# Patient Record
Sex: Male | Born: 1965 | Race: White | Hispanic: No | Marital: Married | Smoking: Never smoker
Health system: Southern US, Community
[De-identification: ages and names within clinical notes are randomized; demographics above are authoritative.]

## PROBLEM LIST (undated history)

## (undated) DIAGNOSIS — K922 Gastrointestinal hemorrhage, unspecified: Secondary | ICD-10-CM

## (undated) DIAGNOSIS — T7840XA Allergy, unspecified, initial encounter: Secondary | ICD-10-CM

## (undated) DIAGNOSIS — E785 Hyperlipidemia, unspecified: Secondary | ICD-10-CM

## (undated) DIAGNOSIS — I2510 Atherosclerotic heart disease of native coronary artery without angina pectoris: Secondary | ICD-10-CM

## (undated) HISTORY — DX: Atherosclerotic heart disease of native coronary artery without angina pectoris: I25.10

## (undated) HISTORY — DX: Allergy, unspecified, initial encounter: T78.40XA

## (undated) HISTORY — DX: Gastrointestinal hemorrhage, unspecified: K92.2

## (undated) HISTORY — DX: Hyperlipidemia, unspecified: E78.5

---

## 2007-02-25 ENCOUNTER — Ambulatory Visit: Payer: Self-pay | Admitting: Family Medicine

## 2008-01-26 ENCOUNTER — Ambulatory Visit: Payer: Self-pay | Admitting: Family Medicine

## 2009-01-11 ENCOUNTER — Ambulatory Visit: Payer: Self-pay | Admitting: Family Medicine

## 2009-05-02 ENCOUNTER — Ambulatory Visit: Payer: Self-pay | Admitting: Family Medicine

## 2009-05-10 ENCOUNTER — Ambulatory Visit: Payer: Self-pay | Admitting: Sports Medicine

## 2009-05-10 DIAGNOSIS — M722 Plantar fascial fibromatosis: Secondary | ICD-10-CM | POA: Insufficient documentation

## 2009-05-10 DIAGNOSIS — M7660 Achilles tendinitis, unspecified leg: Secondary | ICD-10-CM | POA: Insufficient documentation

## 2009-05-10 DIAGNOSIS — M7750 Other enthesopathy of unspecified foot: Secondary | ICD-10-CM | POA: Insufficient documentation

## 2009-06-14 ENCOUNTER — Ambulatory Visit: Payer: Self-pay | Admitting: Sports Medicine

## 2010-03-01 ENCOUNTER — Ambulatory Visit: Payer: Self-pay | Admitting: Sports Medicine

## 2010-03-01 DIAGNOSIS — M25519 Pain in unspecified shoulder: Secondary | ICD-10-CM | POA: Insufficient documentation

## 2010-03-27 ENCOUNTER — Ambulatory Visit: Payer: Self-pay | Admitting: Family Medicine

## 2010-11-15 NOTE — Assessment & Plan Note (Signed)
Summary: F/U,MC   Vital Signs:  Patient profile:   45 year old male BP sitting:   125 / 84  Vitals Entered By: Lillia Pauls CMA (Mar 01, 2010 10:06 AM)  History of Present Illness: Bikes, running and tennis still had problems w left achilles did exercises went 8 wks after stopping biking  also stopped some tennis  now feels some throbbing p bike ride  runs -20 to 24 mpw  some tightness in left AT again  has had a recent cleat fitting  Physical Exam  General:  Well-developed,well-nourished,in no acute distress; alert,appropriate and cooperative throughout examination Msk:  RT has mod mid foot pronation left slt pronation short 1 MT segments bilat mortons type foot bilat  NO AT nodules today Additional Exam:  MSK [place]  LT AT is now 0.45 thickness and was 0.61 RT AT is now 0.44 tendon looks good  resolution of fluid there does remain some increase in bursal fluid on left only  images saved   Impression & Recommendations:  Problem # 1:  ACHILLES TENDINITIS (ICD-726.71)  Orders: Sports Insoles (F6213)  this is much improved still gets some pain in bike shoes change to soft heel pad remove the OTC orthotic  for running cont in sports insoles do the exerc at least 3x wk  Problem # 2:  SHOULDER PAIN, RIGHT (ICD-719.41) with neg exam I think this is tennis form issue  suggested working w pro on serving form  reck if pain resumes

## 2011-03-12 ENCOUNTER — Ambulatory Visit: Payer: Self-pay | Admitting: Family Medicine

## 2011-03-12 ENCOUNTER — Ambulatory Visit (INDEPENDENT_AMBULATORY_CARE_PROVIDER_SITE_OTHER): Payer: BC Managed Care – PPO | Admitting: Family Medicine

## 2011-03-12 ENCOUNTER — Encounter: Payer: Self-pay | Admitting: Family Medicine

## 2011-03-12 VITALS — BP 110/70 | HR 66 | Wt 177.0 lb

## 2011-03-12 DIAGNOSIS — M79671 Pain in right foot: Secondary | ICD-10-CM

## 2011-03-12 DIAGNOSIS — B3789 Other sites of candidiasis: Secondary | ICD-10-CM

## 2011-03-12 DIAGNOSIS — M79609 Pain in unspecified limb: Secondary | ICD-10-CM

## 2011-03-12 DIAGNOSIS — J029 Acute pharyngitis, unspecified: Secondary | ICD-10-CM

## 2011-03-12 LAB — POCT RAPID STREP A (OFFICE): Rapid Strep A Screen: NEGATIVE

## 2011-03-12 MED ORDER — FLUCONAZOLE 150 MG PO TABS: 150.0000 mg | ORAL_TABLET | Freq: Once | ORAL | Status: AC

## 2011-03-12 NOTE — Progress Notes (Signed)
  Subjective:    Patient ID: Travis Hoffman, male    DOB: 1966/06/17, 45 y.o.   MRN: [id number]  HPI he has a 3 day history of slight sore throat but today got worse and is associated with white spots on his tonsils; he also has a  headache. No cough, congestion, earache, rhinorrhea. He does not smoke. He also has a two-week history of right medial ankle pain. He runs and cycles regularly. No change and is running or cycling pattern, shoes. He did state that he use to spare pair socks. There is no discomfort with physical activity. Has hurt more after he is done    Review of Systems     Objective:   Physical Exam  alert and in no distress. Tympanic membranes and canals are normal. Throat shows whitish exudates that were KOH positive. Tonsils are normal. Neck is supple without adenopathy or thyromegaly. Cardiac exam shows a regular sinus rhythm without murmurs or gallops. Lungs are clear to auscultation. Strep test negative   right ankle exam shows slight tenderness to palpation to the posterior aspect of the medial malleolus. Full motion of the ankle. No laxity noted. Good strength.    Assessment & Plan:  Yeast pharyngitis Right ankle pain  I will give Diflucan 150 mg. Also recommend anti-inflammatory and wearing thicker sock to see if this will help.

## 2011-03-12 NOTE — Patient Instructions (Signed)
Use heat for 20 minutes 3 times a day, Advil for the pain and wear thicker socks. Call in a pill for the thrush

## 2011-04-02 ENCOUNTER — Encounter: Payer: Self-pay | Admitting: Family Medicine

## 2011-04-22 ENCOUNTER — Encounter: Payer: Self-pay | Admitting: Family Medicine

## 2011-04-24 ENCOUNTER — Ambulatory Visit (INDEPENDENT_AMBULATORY_CARE_PROVIDER_SITE_OTHER): Payer: BC Managed Care – PPO | Admitting: Family Medicine

## 2011-04-24 ENCOUNTER — Encounter: Payer: Self-pay | Admitting: Family Medicine

## 2011-04-24 VITALS — BP 110/70 | HR 60 | Ht 72.0 in | Wt 180.0 lb

## 2011-04-24 DIAGNOSIS — G4484 Primary exertional headache: Secondary | ICD-10-CM

## 2011-04-24 DIAGNOSIS — Z0000 Encounter for general adult medical examination without abnormal findings: Secondary | ICD-10-CM

## 2011-04-24 DIAGNOSIS — Z8249 Family history of ischemic heart disease and other diseases of the circulatory system: Secondary | ICD-10-CM

## 2011-04-24 DIAGNOSIS — J45990 Exercise induced bronchospasm: Secondary | ICD-10-CM

## 2011-04-24 DIAGNOSIS — R51 Headache: Secondary | ICD-10-CM

## 2011-04-24 LAB — POCT URINALYSIS DIPSTICK
Glucose, UA: NEGATIVE
Ketones, UA: NEGATIVE
Protein, UA: NEGATIVE
Spec Grav, UA: 1.015

## 2011-04-24 MED ORDER — ROSUVASTATIN CALCIUM 20 MG PO TABS: 20.0000 mg | ORAL_TABLET | Freq: Every day | ORAL | Status: DC

## 2011-04-24 NOTE — Progress Notes (Signed)
  Subjective:    Patient ID: Travis Hoffman, male    DOB: [birth year], 45 y.o.   MRN: [id number]  HPI He is here for a complete examination. He maintains a very physically active lifestyle. He has had some very his orthopedic problems and now is having some foot discomfort and is putting heat on this. 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 He does complain of difficulty with a diffuse headache especially after a good workout. He's had no numbness, tingling, blurred or double vision. There is a family history of heart disease with his father and mother having stents. His father had it in his early 61s. His social and family history were reviewed and are in the record.   Review of Systems  Constitutional: Negative.   HENT: Negative.   Eyes: Negative.   Respiratory: Negative.   Cardiovascular: Negative.   Gastrointestinal: Negative.   Genitourinary: Negative.   Neurological: Negative.        Objective:   Physical Exam BP 110/70  Pulse 60  Ht 6' (1.829 m)  Wt 180 lb (81.647 kg)  BMI 24.41 kg/m2  General Appearance:    Alert, cooperative, no distress, appears stated age  Head:    Normocephalic, without obvious abnormality, atraumatic  Eyes:    PERRL, conjunctiva/corneas clear, EOM's intact, fundi    benign  Ears:    Normal TM's and external ear canals  Nose:   Nares normal, mucosa normal, no drainage or sinus   tenderness  Throat:   Lips, mucosa, and tongue normal; teeth and gums normal  Neck:   Supple, no lymphadenopathy;  thyroid:  no   enlargement/tenderness/nodules; no carotid   bruit or JVD  Back:    Spine nontender, no curvature, ROM normal, no CVA     tenderness  Lungs:     Clear to auscultation bilaterally without wheezes, rales or     ronchi; respirations unlabored  Chest Wall:    No tenderness or deformity   Heart:    Regular rate and rhythm, S1 and S2 normal, no murmur, rub   or gallop  Breast Exam:     No chest wall tenderness, masses or gynecomastia  Abdomen:     Soft, non-tender, nondistended, normoactive bowel sounds,    no masses, no hepatosplenomegaly  Genitalia:    Normal male external genitalia without lesions.  Testicles without masses.  No inguinal hernias.  Rectal:    Normal sphincter tone, no masses or tenderness; guaiac negative stool.  Prostate smooth, no nodules, not enlarged.  Extremities:   No clubbing, cyanosis or edema  Pulses:   2+ and symmetric all extremities  Skin:   Skin color, texture, turgor normal, no rashes or lesions  Lymph nodes:   Cervical, supraclavicular, and axillary nodes normal  Neurologic:   CNII-XII intact, normal strength, sensation and gait; reflexes 2+ and symmetric throughout          Psych:   Normal mood, affect, hygiene and grooming.           Assessment & Plan:   Family history of heart disease. EIB exertional headache Recommend Tylenol or Advil for exertional headache. Continue on Crestor. Continue with his physical activity level. We discussed followup on his heart disease. Explained that there is no protocol concerning when to send him for evaluation.

## 2011-04-24 NOTE — Patient Instructions (Signed)
Premedicate yourself prior to exercise with Tylenol or Advil to help with her headaches. Continue on your present medications.

## 2011-04-25 ENCOUNTER — Telehealth: Payer: Self-pay

## 2011-04-25 LAB — COMPREHENSIVE METABOLIC PANEL
BUN: 17 mg/dL (ref 6–23)
CO2: 25 mEq/L (ref 19–32)
Calcium: 9.5 mg/dL (ref 8.4–10.5)
Chloride: 102 mEq/L (ref 96–112)
Creat: 1.03 mg/dL (ref 0.50–1.35)
Glucose, Bld: 87 mg/dL (ref 70–99)

## 2011-04-25 LAB — CBC WITH DIFFERENTIAL/PLATELET
Basophils Absolute: 0 10*3/uL (ref 0.0–0.1)
Eosinophils Relative: 5 % (ref 0–5)
HCT: 41.4 % (ref 39.0–52.0)
Lymphocytes Relative: 37 % (ref 12–46)
Lymphs Abs: 2.3 10*3/uL (ref 0.7–4.0)
MCV: 81.5 fL (ref 78.0–100.0)
Monocytes Absolute: 0.6 10*3/uL (ref 0.1–1.0)
Monocytes Relative: 9 % (ref 3–12)
RDW: 14.3 % (ref 11.5–15.5)
WBC: 6.2 10*3/uL (ref 4.0–10.5)

## 2011-04-25 LAB — LIPID PANEL
Cholesterol: 187 mg/dL (ref 0–200)
HDL: 62 mg/dL (ref >39)
Total CHOL/HDL Ratio: 3 Ratio

## 2011-04-25 NOTE — Telephone Encounter (Signed)
Left message for pt of labs and mailed diet info

## 2011-06-07 ENCOUNTER — Other Ambulatory Visit: Payer: Self-pay | Admitting: Family Medicine

## 2011-12-05 ENCOUNTER — Telehealth: Payer: Self-pay | Admitting: Internal Medicine

## 2011-12-05 NOTE — Telephone Encounter (Signed)
Called pt left message for him he needed an appt to come in and discuss change of med due to ins no longer paying for crestor

## 2011-12-05 NOTE — Telephone Encounter (Signed)
Patient needs an appointment to discuss switching meds.

## 2011-12-12 ENCOUNTER — Ambulatory Visit (INDEPENDENT_AMBULATORY_CARE_PROVIDER_SITE_OTHER): Payer: BC Managed Care – PPO | Admitting: Family Medicine

## 2011-12-12 VITALS — Wt 177.0 lb

## 2011-12-12 DIAGNOSIS — E785 Hyperlipidemia, unspecified: Secondary | ICD-10-CM

## 2011-12-12 MED ORDER — ATORVASTATIN CALCIUM 40 MG PO TABS: 40.0000 mg | ORAL_TABLET | Freq: Every day | ORAL | Status: DC

## 2011-12-12 NOTE — Patient Instructions (Signed)
If you have any trouble with aches and pains let me know

## 2011-12-12 NOTE — Progress Notes (Signed)
  Subjective:    Patient ID: Travis Hoffman, male    DOB: 10-16-1965, 46 y.o.   MRN: [id number]  HPI He is here for consultation. His insurance will not cover Crestor. He has not been on any other medications. Review of his record indicates he does have a positive family history for heart disease. He has had no difficulty with Crestor.  Review of Systems     Objective:   Physical Exam Alert and in no distress otherwise not examined       Assessment & Plan:  Hyperlipidemia. I will place him on Lipitor. Discussed possible side effects of the medication with him. He will return here in 2 months for recheck or call me if he has any side effects.

## 2012-02-11 ENCOUNTER — Ambulatory Visit: Payer: BC Managed Care – PPO | Admitting: Family Medicine

## 2012-03-11 ENCOUNTER — Encounter: Payer: Self-pay | Admitting: Family Medicine

## 2012-03-11 ENCOUNTER — Ambulatory Visit (INDEPENDENT_AMBULATORY_CARE_PROVIDER_SITE_OTHER): Payer: BC Managed Care – PPO | Admitting: Family Medicine

## 2012-03-11 VITALS — BP 120/70 | HR 59 | Wt 176.0 lb

## 2012-03-11 DIAGNOSIS — M7750 Other enthesopathy of unspecified foot: Secondary | ICD-10-CM

## 2012-03-11 DIAGNOSIS — Z8249 Family history of ischemic heart disease and other diseases of the circulatory system: Secondary | ICD-10-CM | POA: Insufficient documentation

## 2012-03-11 DIAGNOSIS — Z23 Encounter for immunization: Secondary | ICD-10-CM

## 2012-03-11 DIAGNOSIS — E785 Hyperlipidemia, unspecified: Secondary | ICD-10-CM

## 2012-03-11 DIAGNOSIS — Z79899 Other long term (current) drug therapy: Secondary | ICD-10-CM

## 2012-03-11 LAB — LIPID PANEL
Cholesterol: 142 mg/dL (ref 0–200)
LDL Cholesterol: 82 mg/dL (ref 0–99)
Total CHOL/HDL Ratio: 2.9 Ratio
VLDL: 11 mg/dL (ref 0–40)

## 2012-03-11 NOTE — Progress Notes (Signed)
  Subjective:    Patient ID: Travis Hoffman, male    DOB: 1965-12-24, 46 y.o.   MRN: [id number]  HPI He is here originally for recheck on his lipids. He does have a positive family history for heart disease. Review his record also indicates need for immunization update. He also complains of difficulty with his left foot. He does have a previous history of difficulty with this. This causes him mainly over the metatarsal area when he rides his bicycle. He does use of foot clip. His pain is mainly in the second and third metatarsal area.   Review of Systems     Objective:   Physical Exam Slight callus formation noted over the head of the second metatarsal. No tenderness to palpation. Compression testing negative. Good motion of his ankle.       Assessment & Plan:   1. METATARSALGIA    2. Hyperlipidemia LDL goal <70  Lipid panel  3. Encounter for long-term (current) use of other medications  Lipid panel  4. Family history of heart disease in male family member before age 78    5. Need for immunization against pertussis  Tdap vaccine greater than or equal to 7yo IM   discussed various options concerning the metatarsal pain. Recommended to possibly try changing the valgus versus varus stress and possibly using an arch support to take some pressure off the metatarsal heads.

## 2012-03-11 NOTE — Patient Instructions (Signed)
Try building up in your arch area to take some pressure off the metatarsals

## 2012-11-04 ENCOUNTER — Other Ambulatory Visit: Payer: Self-pay | Admitting: Family Medicine

## 2013-02-01 ENCOUNTER — Encounter: Payer: Self-pay | Admitting: Family Medicine

## 2013-02-01 ENCOUNTER — Ambulatory Visit (INDEPENDENT_AMBULATORY_CARE_PROVIDER_SITE_OTHER): Payer: Managed Care, Other (non HMO) | Admitting: Family Medicine

## 2013-02-01 VITALS — BP 114/70 | HR 72 | Ht 72.5 in | Wt 178.0 lb

## 2013-02-01 DIAGNOSIS — G4484 Primary exertional headache: Secondary | ICD-10-CM | POA: Insufficient documentation

## 2013-02-01 DIAGNOSIS — G8929 Other chronic pain: Secondary | ICD-10-CM | POA: Insufficient documentation

## 2013-02-01 DIAGNOSIS — J45990 Exercise induced bronchospasm: Secondary | ICD-10-CM

## 2013-02-01 DIAGNOSIS — Z0000 Encounter for general adult medical examination without abnormal findings: Secondary | ICD-10-CM

## 2013-02-01 DIAGNOSIS — M545 Low back pain, unspecified: Secondary | ICD-10-CM

## 2013-02-01 DIAGNOSIS — Z8249 Family history of ischemic heart disease and other diseases of the circulatory system: Secondary | ICD-10-CM

## 2013-02-01 DIAGNOSIS — R51 Headache: Secondary | ICD-10-CM

## 2013-02-01 LAB — LIPID PANEL
Cholesterol: 175 mg/dL (ref 0–200)
HDL: 58 mg/dL (ref >39)
LDL Cholesterol: 107 mg/dL — ABNORMAL HIGH (ref 0–99)
Triglycerides: 50 mg/dL (ref <150)

## 2013-02-01 LAB — COMPREHENSIVE METABOLIC PANEL
Alkaline Phosphatase: 48 U/L (ref 39–117)
BUN: 20 mg/dL (ref 6–23)
Chloride: 102 mEq/L (ref 96–112)
Creat: 0.95 mg/dL (ref 0.50–1.35)
Glucose, Bld: 94 mg/dL (ref 70–99)
Potassium: 4.5 mEq/L (ref 3.5–5.3)
Total Protein: 7 g/dL (ref 6.0–8.3)

## 2013-02-01 LAB — CBC WITH DIFFERENTIAL/PLATELET
Basophils Absolute: 0 10*3/uL (ref 0.0–0.1)
Basophils Relative: 1 % (ref 0–1)
Eosinophils Relative: 5 % (ref 0–5)
HCT: 40.2 % (ref 39.0–52.0)
Hemoglobin: 13.3 g/dL (ref 13.0–17.0)
MCHC: 33.1 g/dL (ref 30.0–36.0)
MCV: 79 fL (ref 78.0–100.0)
Monocytes Absolute: 0.4 10*3/uL (ref 0.1–1.0)
Monocytes Relative: 7 % (ref 3–12)
Neutro Abs: 2.5 10*3/uL (ref 1.7–7.7)
RDW: 14.5 % (ref 11.5–15.5)

## 2013-02-01 LAB — POCT URINALYSIS DIPSTICK
Blood, UA: NEGATIVE
Ketones, UA: NEGATIVE
Leukocytes, UA: NEGATIVE
Protein, UA: NEGATIVE
Spec Grav, UA: 1.015
pH, UA: 6

## 2013-02-01 LAB — HEMOCCULT GUIAC POC 1CARD (OFFICE)

## 2013-02-01 MED ORDER — ALBUTEROL SULFATE HFA 108 (90 BASE) MCG/ACT IN AERS: 2.0000 | INHALATION_SPRAY | Freq: Four times a day (QID) | RESPIRATORY_TRACT | Status: DC | PRN

## 2013-02-01 NOTE — Progress Notes (Signed)
Subjective:    Patient ID: Travis Hoffman, male    DOB: 1966-07-29, 47 y.o.   MRN: [id number]  HPI He is here for complete examination. He continues to have difficulty with left-sided back discomfort. He is not interested in taking any medications but just wanted to inform me of this. He also has EIB and continues on Xopenex. He was originally given this due to sympathetic effect of the albuterol however he notes the same symptoms with the Xopenex and would like to be switched to the lesser cost version. He also has a history of exertional headaches especially after long physical activity. Usually this goes away within a short period time at this time he is not interested in pursuing this.He is noting some difficulty with reading but has yet not gotten reading glasses. He also has concerns over his physical activity and risk of cardiac disease. He also has concerns over his toenails.   Review of Systems Negative except as above    Objective:   Physical Exam BP 114/70  Pulse 72  Ht 6' 0.5" (1.842 m)  Wt 178 lb (80.74 kg)  BMI 23.8 kg/m2  General Appearance:    Alert, cooperative, no distress, appears stated age  Head:    Normocephalic, without obvious abnormality, atraumatic  Eyes:    PERRL, conjunctiva/corneas clear, EOM's intact, fundi    benign  Ears:    Normal TM's and external ear canals  Nose:   Nares normal, mucosa normal, no drainage or sinus   tenderness  Throat:   Lips, mucosa, and tongue normal; teeth and gums normal  Neck:   Supple, no lymphadenopathy;  thyroid:  no   enlargement/tenderness/nodules; no carotid   bruit or JVD  Back:    Spine nontender, no curvature, ROM normal, no CVA     tenderness  Lungs:     Clear to auscultation bilaterally without wheezes, rales or     ronchi; respirations unlabored  Chest Wall:    No tenderness or deformity   Heart:    Regular rate and rhythm, S1 and S2 normal, no murmur, rub   or gallop  Breast Exam:    No chest wall tenderness,  masses or gynecomastia  Abdomen:     Soft, non-tender, nondistended, normoactive bowel sounds,    no masses, no hepatosplenomegaly  Genitalia:  deferred  Rectal:    Normal sphincter tone, no masses or tenderness; guaiac negative stool.  Prostate smooth, no nodules, not enlarged.  Extremities:   No clubbing, cyanosis or edema  Pulses:   2+ and symmetric all extremities  Skin:   Skin color, texture, turgor normal, no rashes or lesions  Lymph nodes:   Cervical, supraclavicular, and axillary nodes normal  Neurologic:   CNII-XII intact, normal strength, sensation and gait; reflexes 2+ and symmetric throughout          Psych:   Normal mood, affect, hygiene and grooming.        Assessment & Plan:  Routine general medical examination at a health care facility - Plan: Lipid panel, CBC with Differential, Comprehensive metabolic panel, Urinalysis Dipstick, POCT occult blood stool  Family history of heart disease in male family member before age 35  Exercise-induced asthma - Plan: albuterol (PROVENTIL HFA;VENTOLIN HFA) 108 (90 BASE) MCG/ACT inhaler  Exertional headache  Chronic low back pain I discussed his risk for heart disease in the fact that he is taking good care of himself. I do the present circumstances no further intervention is really needed. He  was comfortable with this. I will switch him to Proventil and give him refills as needed. He is comfortable with handling his exertional headaches. We also discussed proper care for his back with stretching and range of motion. He has no evidence of onychomycosis therefore no intervention is needed.

## 2013-02-02 NOTE — Addendum Note (Signed)
Addended by: Ronnald Nian on: 02/02/2013 05:22 PM   Modules accepted: Level of Service

## 2013-02-02 NOTE — Progress Notes (Signed)
Quick Note:  CALLED PT TO INFORM HIM OF LABS LEFT MESSAGE WORD FOR WORD Labs look good. Continue present medications ______

## 2013-02-05 ENCOUNTER — Other Ambulatory Visit: Payer: Self-pay | Admitting: Family Medicine

## 2013-02-05 MED ORDER — ATORVASTATIN CALCIUM 40 MG PO TABS: 40.0000 mg | ORAL_TABLET | Freq: Every day | ORAL | Status: DC

## 2013-02-05 NOTE — Telephone Encounter (Signed)
Needs lipitor refill sent to [facility] mail order pharmacy

## 2013-07-22 ENCOUNTER — Other Ambulatory Visit: Payer: Self-pay | Admitting: Family Medicine

## 2013-08-19 ENCOUNTER — Other Ambulatory Visit: Payer: Self-pay

## 2014-02-07 ENCOUNTER — Encounter: Payer: Self-pay | Admitting: Family Medicine

## 2014-02-07 ENCOUNTER — Ambulatory Visit (INDEPENDENT_AMBULATORY_CARE_PROVIDER_SITE_OTHER): Payer: BC Managed Care – PPO | Admitting: Family Medicine

## 2014-02-07 VITALS — BP 116/78 | HR 64 | Ht 73.0 in | Wt 177.0 lb

## 2014-02-07 DIAGNOSIS — M545 Low back pain, unspecified: Secondary | ICD-10-CM

## 2014-02-07 DIAGNOSIS — E785 Hyperlipidemia, unspecified: Secondary | ICD-10-CM

## 2014-02-07 DIAGNOSIS — G8929 Other chronic pain: Secondary | ICD-10-CM

## 2014-02-07 DIAGNOSIS — J3489 Other specified disorders of nose and nasal sinuses: Secondary | ICD-10-CM

## 2014-02-07 DIAGNOSIS — Z0000 Encounter for general adult medical examination without abnormal findings: Secondary | ICD-10-CM

## 2014-02-07 DIAGNOSIS — J45990 Exercise induced bronchospasm: Secondary | ICD-10-CM

## 2014-02-07 DIAGNOSIS — Z8249 Family history of ischemic heart disease and other diseases of the circulatory system: Secondary | ICD-10-CM

## 2014-02-07 LAB — CBC WITH DIFFERENTIAL/PLATELET
BASOS ABS: 0 10*3/uL (ref 0.0–0.1)
BASOS PCT: 0 % (ref 0–1)
Eosinophils Absolute: 0.2 10*3/uL (ref 0.0–0.7)
Eosinophils Relative: 4 % (ref 0–5)
HCT: 39.3 % (ref 39.0–52.0)
HEMOGLOBIN: 13.3 g/dL (ref 13.0–17.0)
Lymphocytes Relative: 39 % (ref 12–46)
Lymphs Abs: 2.2 10*3/uL (ref 0.7–4.0)
MCH: 27 pg (ref 26.0–34.0)
MCHC: 33.8 g/dL (ref 30.0–36.0)
MCV: 79.9 fL (ref 78.0–100.0)
MONOS PCT: 10 % (ref 3–12)
Monocytes Absolute: 0.6 10*3/uL (ref 0.1–1.0)
NEUTROS ABS: 2.7 10*3/uL (ref 1.7–7.7)
NEUTROS PCT: 47 % (ref 43–77)
Platelets: 229 10*3/uL (ref 150–400)
RBC: 4.92 MIL/uL (ref 4.22–5.81)
RDW: 14.6 % (ref 11.5–15.5)
WBC: 5.7 10*3/uL (ref 4.0–10.5)

## 2014-02-07 LAB — LIPID PANEL
Cholesterol: 162 mg/dL (ref 0–200)
HDL: 56 mg/dL (ref >39)
LDL CALC: 97 mg/dL (ref 0–99)
TRIGLYCERIDES: 44 mg/dL (ref <150)
Total CHOL/HDL Ratio: 2.9 Ratio
VLDL: 9 mg/dL (ref 0–40)

## 2014-02-07 LAB — POCT URINALYSIS DIPSTICK
Bilirubin, UA: NEGATIVE
Blood, UA: NEGATIVE
Glucose, UA: NEGATIVE
KETONES UA: NEGATIVE
LEUKOCYTES UA: NEGATIVE
Nitrite, UA: NEGATIVE
PROTEIN UA: NEGATIVE
Spec Grav, UA: 1.015
UROBILINOGEN UA: NEGATIVE
pH, UA: 6

## 2014-02-07 LAB — HEMOCCULT GUIAC POC 1CARD (OFFICE)

## 2014-02-07 LAB — COMPREHENSIVE METABOLIC PANEL
ALK PHOS: 47 U/L (ref 39–117)
ALT: 23 U/L (ref 0–53)
AST: 29 U/L (ref 0–37)
Albumin: 4.4 g/dL (ref 3.5–5.2)
BILIRUBIN TOTAL: 0.9 mg/dL (ref 0.2–1.2)
BUN: 19 mg/dL (ref 6–23)
CO2: 28 mEq/L (ref 19–32)
Calcium: 9.4 mg/dL (ref 8.4–10.5)
Chloride: 104 mEq/L (ref 96–112)
Creat: 0.97 mg/dL (ref 0.50–1.35)
Glucose, Bld: 88 mg/dL (ref 70–99)
Potassium: 4.5 mEq/L (ref 3.5–5.3)
SODIUM: 138 meq/L (ref 135–145)
TOTAL PROTEIN: 6.5 g/dL (ref 6.0–8.3)

## 2014-02-07 MED ORDER — ATORVASTATIN CALCIUM 40 MG PO TABS: ORAL_TABLET | ORAL | Status: DC

## 2014-02-07 NOTE — Progress Notes (Signed)
Subjective:    Patient ID: Travis Hoffman, male    DOB: 10/06/1966, 48 y.o.   MRN: [id number]  HPI  Travis Hoffman is a very pleasant 48 y.o. yo male who  has a past medical history of Allergy; Dyslipidemia; GIB (gastrointestinal bleeding); and Cardiovascular disease. He presents today for his annual physical.   The patient is doing well but has a few matters he would like to discuss today. The patient reports some pain in the distal joints of the hands that has been present for many years. The pain has been getting slightly worse over the course of the last few months. The patient also describes the worst pain being on his index finger where he has also noted a "bump." He isn't sure when the bump appeared but notes that it is not red, swollen or particularly painful. The patient reports that he has recently added tumeric and ginger to his diet, and believes this may be helping.  The patient also reports residual soreness in his left hand from a fall in December. The patient fell on his outstretched hand and at the time experienced some sharp pains with tight gripping actions. Now those pains have resolved and now has occasional soreness deep near the distal palm and pain with deep palpation of that same area. The patient denies any sharp shooting pain, pain with movement, numbness, tingling or lack of strength in that hand.     The patient recently also began experience pain with lateral movement of his wrist. The pain began three weeks ago when the patient changed the handlebar adjustments on his bike. Since readjusting the handlebars the patient feels his pain is improving but does think it may be taking too long to resolve. The patient denies any sharp shooting pain, pain with movement, numbness, tingling or lack of strength in his wrists.   The patient has also had new occasional sharp pains in his stomach. The pain is located bilaterally in his lower abdomen and has been occuring off and  on for the last month or so. The patient believes the pain is worse in the evenings and worse when he hasn't eaten in 3-4 hours. The patient believes these pains are related to some anxiety concerning his daughter at home and that his anxiety causes these pains to be worse in the evening when he is dealing with those stressors. The patient denies any diarrhea, constipation, changes in stool appearance, nausea, vomiting, unintentional weight loss or early satiety.   The patient would also like a bump on his noes looked at. The spot is located on the left side and has been present the patient's whole life. The patient reports no new changes, itching, bleeding or growth of the area.   The patient's family life is stable other than the previously mentioned anxiety concerning his daughter. His daughter has a history of mental health issues and the patient is worried about a "slippry slope" effect. Despite this the patient notes that he thinks his anxiety in the given situation is "excessive" and he feels like the stress is affecting him more than it should. He has never experienced excessive anxiety in the past and is not sure why this particular situation has triggered this. He has occasionally experienced chest tightness (not related to exertion) associated with this anxiety. The patient exercises everyday and feels that this may help him cope with some anxiety. He does not wish to pursue medical therapy for his anxiety at this time.  The patient feels that his exercise induced asthma is well controlled with his inhaler, though he does note incomplete relief with very strenuous interval training on cold dry days.   The patient reports taking his medications as prescribed and needs refills at this time. The patient was concerned about his own cholesterol given his fathers history of early CV disease. He was counciled extensively on the biochemistry of lipids, the role of the liver, the impact of diet and  exercise, importance of medications and his personal overall risk factors.   The patient has between 0-4 alcoholic drinks a week and does not smoke tobacco. The patient has an excellent diet with lots of vegetables and proteins and minimal carbohydrates and sugar. The patient exercises 7 times a week with both interval, cardio and strength training.   Health Maintenance: patient is UTD on Tdap and we will do a DRE today for prostate and colon CA screening.   Review of Systems is negative except per HPI.    Objective:   Physical Exam  Constitutional: Patient is well-developed, well-nourished, and in no distress. HENT: Head is normocephalic and atraumatic.  Mouth/Throat: Oropharynx is clear and moist without erythema or exudates Nose: 1 mm umbilicate papule located on the left nasal sidewall. Central umbilication is black in color. No erythema or swelling at the site.   Eyes: Conjunctivae and EOM are normal. Pupils are equal, round, and reactive to light.  Cardiovascular: Normal rate, regular rhythm. Exam reveals no murmurs, gallops and no friction rub.  Pulmonary/Chest: Effort normal and CTAB. No respiratory distress. No wheezes or ronchi.   Abdominal: Soft, non-tender and non-distended. There is no rebound or guarding. No HSM.  Neurological: Patient is alert and oriented to person, place, and time.  Reflex Scores: Biceps and Patellar reflexes were 2+ and equal bilaterally.  Skin: Skin is warm and dry. 1 cm lesion with a central area of pigmentation that is slightly indented is noted on the right nasal labial area. DRE: smooth prostate without obvious irregularities or nodularities Psychiatric: Affect normal.   FOBT: negative    Assessment & Plan:  Routine general medical examination at a health care facility - Plan: Lipid Panel, CBC with Differential, POCT urinalysis dipstick, Comprehensive metabolic panel, POCT occult blood stool  Family history of heart disease in male family member  before age 47  Hyperlipidemia LDL goal <70 - Plan: atorvastatin (LIPITOR) 40 MG tablet  Exercise-induced asthma  Chronic low back pain  Nasal lesion - Plan: Ambulatory referral to Dermatology  Patient was counciled extensively on lipids, personal cardivascular risk profile and the impact of diet and exercise. We also discussed the stress he is under dealing with his daughter. Encouraged him to get involved in counseling to learn how to better handle it from his perspective and secondarily his daughter. Apparently his wife has a better handle on this. Discussed the aches and pains that he is having in general and explained a lot of arthritic in nature. The abdominal pain I think will eventually go way and does seem to be psychological in nature.

## 2014-02-07 NOTE — Progress Notes (Deleted)
 Subjective:    Patient ID: Travis Hoffman, male    DOB: 07/06/1966, 48 y.o.   MRN: [id number]  HPI  [name] is a very pleasant 48 y.o. yo male who  has a past medical history of Allergy; Dyslipidemia; GIB (gastrointestinal bleeding); and Cardiovascular disease. He presents today for his annual physical.   The patient is doing well but has a few matters he would like to discuss today. The patient reports some pain in the distal joints of the hands that has been present for many years. The pain has been getting slightly worse over the course of the last few months. The patient also describes the worst pain being on his index finger where he has also noted a "bump." He isn't sure when the bump appeared but notes that it is not red, swollen or particularly painful. The patient reports that he has recently added tumeric and ginger to his diet, and believes this may be helping.  The patient also reports residual soreness in his left hand from a fall in December. The patient fell on his outstretched hand and at the time experienced some sharp pains with tight gripping actions. Now those pains have resolved and now has occasional soreness deep near the distal palm and pain with deep palpation of that same area. The patient denies any sharp shooting pain, pain with movement, numbness, tingling or lack of strength in that hand.     The patient recently also began experience pain with lateral movement of his wrist. The pain began three weeks ago when the patient changed the handlebar adjustments on his bike. Since readjusting the handlebars the patient feels his pain is improving but does think it may be taking too long to resolve. The patient denies any sharp shooting pain, pain with movement, numbness, tingling or lack of strength in his wrists.   The patient has also had new occasional sharp pains in his stomach. The pain is located bilaterally in his lower abdomen and has been occuring off and  on for the last month or so. The patient believes the pain is worse in the evenings and worse when he hasn't eaten in 3-4 hours. The patient believes these pains are related to some anxiety concerning his daughter at home and that his anxiety causes these pains to be worse in the evening when he is dealing with those stressors. The patient denies any diarrhea, constipation, changes in stool appearance, nausea, vomiting, unintentional weight loss or early satiety.   The patient would also like a bump on his noes looked at. The spot is located on the left side and has been present the patient's whole life. The patient reports no new changes, itching, bleeding or growth of the area.   The patient's family life is stable other than the previously mentioned anxiety concerning his daughter. His daughter has a history of mental health issues and the patient is worried about a "slippry slope" effect. Despite this the patient notes that he thinks his anxiety in the given situation is "excessive" and he feels like the stress is affecting him more than it should. He has never experienced excessive anxiety in the past and is not sure why this particular situation has triggered this. He has occasionally experienced chest tightness (not related to exertion) associated with this anxiety. The patient exercises everyday and feels that this may help him cope with some anxiety. He does not wish to pursue medical therapy for his anxiety at this time.     The patient feels that his exercise induced asthma is well controlled with his inhaler, though he does note incomplete relief with very strenuous interval training on cold dry days.   The patient reports taking his medications as prescribed and needs refills at this time. The patient was concerned about his own cholesterol given his fathers history of early CV disease. He was counciled extensively on the biochemistry of lipids, the role of the liver, the impact of diet and  exercise, importance of medications and his personal overall risk factors.   The patient has between 0-4 alcoholic drinks a week and does not smoke tobacco. The patient has an excellent diet with lots of vegetables and proteins and minimal carbohydrates and sugar. The patient exercises 7 times a week with both interval, cardio and strength training.   Health Maintenance: patient is UTD on Tdap and we will do a DRE today for prostate and colon CA screening.   Review of Systems is negative except per HPI.    Objective:   Physical Exam  Constitutional: Patient is well-developed, well-nourished, and in no distress. HENT: Head is normocephalic and atraumatic.  Mouth/Throat: Oropharynx is clear and moist without erythema or exudates Nose: 1 mm umbilicate papule located on the left nasal sidewall. Central umbilication is black in color. No erythema or swelling at the site.   Eyes: Conjunctivae and EOM are normal. Pupils are equal, round, and reactive to light.  Cardiovascular: Normal rate, regular rhythm. Exam reveals no murmurs, gallops and no friction rub.  Pulmonary/Chest: Effort normal and CTAB. No respiratory distress. No wheezes or ronchi.   Abdominal: Soft, non-tender and non-distended. There is no rebound or guarding. No HSM.  Neurological: Patient is alert and oriented to person, place, and time.  Reflex Scores: Biceps and Patellar reflexes were 2+ and equal bilaterally.  Skin: Skin is warm and dry. 1 cm lesion with a central area of pigmentation that is slightly indented is noted on the right nasal labial area. DRE: smooth prostate without obvious irregularities or nodularities Psychiatric: Affect normal.   FOBT: negative    Assessment & Plan:  Routine general medical examination at a health care facility - Plan: Lipid Panel, CBC with Differential, POCT urinalysis dipstick, Comprehensive metabolic panel, POCT occult blood stool  Family history of heart disease in male family member  before age 47  Hyperlipidemia LDL goal <70 - Plan: atorvastatin (LIPITOR) 40 MG tablet  Exercise-induced asthma  Chronic low back pain  Nasal lesion - Plan: Ambulatory referral to Dermatology  Patient was counciled extensively on lipids, personal cardivascular risk profile and the impact of diet and exercise. We also discussed the stress he is under dealing with his daughter. Encouraged him to get involved in counseling to learn how to better handle it from his perspective and secondarily his daughter. Apparently his wife has a better handle on this. Discussed the aches and pains that he is having in general and explained a lot of arthritic in nature. The abdominal pain I think will eventually go way and does seem to be psychological in nature.

## 2014-02-07 NOTE — Progress Notes (Deleted)
   Subjective:    Patient ID: Travis Hoffman, male    DOB: 11/19/1965, 48 y.o.   MRN: [id number]  HPI    Review of Systems     Objective:   Physical Exam        Assessment & Plan:

## 2014-02-14 ENCOUNTER — Telehealth: Payer: Self-pay | Admitting: Family Medicine

## 2014-02-14 NOTE — Telephone Encounter (Signed)
Pt called for lab results.  Dr. Susann GivensLalonde put through his my chart all labs are normal.  Pt request copy emailed to [facility] [email or web address]

## 2014-03-01 ENCOUNTER — Ambulatory Visit (INDEPENDENT_AMBULATORY_CARE_PROVIDER_SITE_OTHER): Payer: BC Managed Care – PPO | Admitting: Medical

## 2014-03-01 ENCOUNTER — Telehealth: Payer: Self-pay | Admitting: Internal Medicine

## 2014-03-01 ENCOUNTER — Encounter: Payer: Self-pay | Admitting: Medical

## 2014-03-01 VITALS — BP 110/70 | HR 56 | Temp 98.0°F | Resp 14 | Wt 178.0 lb

## 2014-03-01 DIAGNOSIS — R319 Hematuria, unspecified: Secondary | ICD-10-CM

## 2014-03-01 LAB — POCT URINALYSIS DIPSTICK
BILIRUBIN UA: NEGATIVE
Blood, UA: NEGATIVE
Glucose, UA: NEGATIVE
KETONES UA: NEGATIVE
LEUKOCYTES UA: NEGATIVE
Nitrite, UA: NEGATIVE
PH UA: 6
Spec Grav, UA: 1.015
Urobilinogen, UA: NEGATIVE

## 2014-03-01 NOTE — Telephone Encounter (Signed)
Pt was notified about an appt with [facility] on Tuesday June 9th @ 2:20pm for nasal lesion

## 2014-03-01 NOTE — Progress Notes (Signed)
Subjective: Here for blood in urine . Had similar last year, lasted once urination.   He notes seeing blood/pink color in urine throughout the day yesterday.  Seems to be improving some.   Saw no obvious blood, just pink coloration in the urine.  No urgency, frequency, burning.    Did 1.5 hour mountain bike ride 2 days ago, and that night is when he saw the urine.    He also reports painful hunger pains, thinks its stress related.  Talked to Dr. Susann GivensLalonde about this recently as this physical.     Past Medical History  Diagnosis Date  . Allergy     RHINITIS  . Dyslipidemia   . GIB (gastrointestinal bleeding)   . Cardiovascular disease    ROS as in subjective  Objective: Gen: wd, wn, nad Back nontender Abdomen+bs, soft, nontender, no organomegaly, no mass GU norma male external genitalia, no mass, circumcised, no lymphadnopathy, no hernia Ext: no edema   Assessment: Encounter Diagnosis  Name Primary?  . Blood in urine Yes   Plan: We discussed his concerns.  Pink colored urine likely related to prostate inflammation from recent mountain bike riding.  Urinalysis normal today.  Advised good hydration, avoid mountain biking the next week or more.   He is already seeing improvement.  If worse or ongoing blood, then recheck.

## 2014-03-02 ENCOUNTER — Telehealth: Payer: Self-pay | Admitting: Family Medicine

## 2014-03-02 NOTE — Telephone Encounter (Signed)
Message copied by Janeice RobinsonSCALES, Deron Poole L on Wed Mar 02, 2014  4:02 PM ------      Message from: Jac CanavanYSINGER, Aristotle S      Created: Wed Mar 02, 2014  8:26 AM       I spoke to Dr. Susann GivensLalonde, and he doesn't recall any worrisome exam findings, thus, we will just re-examine at next physical/next appt.  He is low risk for aneurysm given nonsmoker, no high blood pressure.            Have him let me know if the blood doesn't resolve as we discussed.       shane                  ----- Message -----         From: Ronnald NianJohn C Lalonde, MD         Sent: 03/02/2014   8:12 AM           To: Jac Canavanavid S Tysinger, PA-C            He is thin and can probably feel the aorta better but if you have a question, go for it. I don't think that he smokes and his age speaks against it.      ----- Message -----         From: Jac Canavanavid S Tysinger, PA-C         Sent: 03/01/2014   3:08 PM           To: Ronnald NianJohn C Lalonde, MD            Pt was into for blood in urine/pink urine, but on abdominal exam, aorta seemed to have prominent pulsation.  Do you recall feeling similar on his recent physical?  I know the abdominal exam isn't too sensitive for catching AAA, but wanted to see what you recall feeling on his exam.             ------

## 2014-03-02 NOTE — Telephone Encounter (Signed)
Patient [facility] of [facility] and the patient states that he believes the red color in his urine had come from him eating a lot of beats on Sunday. [facility] this [facility]. CLS

## 2014-07-13 ENCOUNTER — Encounter: Payer: Self-pay | Admitting: Internal Medicine

## 2014-07-25 ENCOUNTER — Ambulatory Visit (INDEPENDENT_AMBULATORY_CARE_PROVIDER_SITE_OTHER): Payer: BC Managed Care – PPO | Admitting: Family Medicine

## 2014-07-25 ENCOUNTER — Encounter: Payer: Self-pay | Admitting: Family Medicine

## 2014-07-25 VITALS — BP 104/70 | HR 70 | Ht 73.0 in | Wt 181.0 lb

## 2014-07-25 DIAGNOSIS — M542 Cervicalgia: Secondary | ICD-10-CM

## 2014-07-25 DIAGNOSIS — R42 Dizziness and giddiness: Secondary | ICD-10-CM

## 2014-07-25 NOTE — Patient Instructions (Addendum)
Pay attention to when where and why and see if there is a pattern

## 2014-07-25 NOTE — Progress Notes (Signed)
   Subjective:    Patient ID: Travis FusiDavid Hoffman, male    DOB: 08/11/1966, 48 y.o.   MRN: [id number]  HPI He is here for evaluation of a several month history of intermittent dizziness and occasional nausea. He cannot relate this to head position, physical activity, body position. There is no blurred vision, double vision, numbness, tingling, weakness, earache, sore throat, heart rate changes. The dizziness is very transient lasting usually several minutes. He rides a bicycle regularly and does have some upper back and neck discomfort. He has tried to adjust his bicycle seat to help with this and so far has been unsuccessful.   Review of Systems     Objective:   Physical Exam alert and in no distress. Tympanic membranes and canals are normal. Throat is clear. Tonsils are normal. Neck is supple without adenopathy or thyromegaly. Cardiac exam shows a regular sinus rhythm without murmurs or gallops. Lungs are clear to auscultation. Normal motion of the neck. No tenderness in the upper back area. Specifically no trigger points.       Assessment & Plan:  Neck pain  Dizziness  recommend heat, stretching and possible massage versus chiropractic for his neck. Also discussed the possibility of physical therapy. Recommend he keep track of the dizziness to see if there is any pattern that so far is not identifiable. He is comfortable with this approach. All of his questions were answered.

## 2014-12-12 ENCOUNTER — Encounter: Payer: Self-pay | Admitting: Family Medicine

## 2014-12-12 ENCOUNTER — Ambulatory Visit (INDEPENDENT_AMBULATORY_CARE_PROVIDER_SITE_OTHER): Payer: BLUE CROSS/BLUE SHIELD | Admitting: Family Medicine

## 2014-12-12 VITALS — BP 110/80 | HR 62 | Wt 173.0 lb

## 2014-12-12 DIAGNOSIS — R109 Unspecified abdominal pain: Secondary | ICD-10-CM | POA: Diagnosis not present

## 2014-12-12 DIAGNOSIS — M542 Cervicalgia: Secondary | ICD-10-CM

## 2014-12-12 NOTE — Progress Notes (Signed)
   Subjective:    Patient ID: Travis FusiDavid Hoffman, male    DOB: 08/05/1966, 49 y.o.   MRN: [id number]  HPI He is here for consult concerning left-sided flank pain. He has had a couple of these episodes but they only occur when he really pushes himself or riding a bike. There is no chest pain, shortness of breath, rapid heart rate or weakness. He also states that on long rides he will get upper trapezius pain. He has done massage and chiropractic manipulation but still has this occurring mainly on long bicycle rides.   Review of Systems     Objective:   Physical Exam Alert and in no distress. No chest wall tenderness. Abdominal exam shows no masses in the left upper quadrant. Good motion of his back.       Assessment & Plan:  Left flank pain  Neck pain  I explained that both of these symptoms are probably exertion related. I explained that I did not think you these indicated any major medical issue. Recommend good heat and stretching for the neck and no particular therapy for the flank pain but did explain that I did not think that this was cardiac in nature.

## 2014-12-20 ENCOUNTER — Telehealth: Payer: Self-pay | Admitting: Family Medicine

## 2014-12-20 NOTE — Telephone Encounter (Signed)
Pt had cold, flu symptoms that started last week. Most of his symptoms have gone away but has facial pain and headache over past 3 days. Does he need to seen? Or what can he do?

## 2014-12-20 NOTE — Telephone Encounter (Signed)
Let him know that if he has the same problem when he wakes up, make an appointment

## 2014-12-22 NOTE — Telephone Encounter (Signed)
Called and left a detailed message on vm

## 2015-04-21 ENCOUNTER — Other Ambulatory Visit: Payer: Self-pay | Admitting: Family Medicine

## 2015-06-22 ENCOUNTER — Ambulatory Visit (INDEPENDENT_AMBULATORY_CARE_PROVIDER_SITE_OTHER): Payer: BLUE CROSS/BLUE SHIELD | Admitting: Family Medicine

## 2015-06-22 ENCOUNTER — Encounter: Payer: Self-pay | Admitting: Family Medicine

## 2015-06-22 VITALS — BP 108/80 | HR 60 | Temp 97.6°F | Resp 12 | Ht 72.0 in | Wt 176.4 lb

## 2015-06-22 DIAGNOSIS — M545 Low back pain, unspecified: Secondary | ICD-10-CM

## 2015-06-22 DIAGNOSIS — Z0000 Encounter for general adult medical examination without abnormal findings: Secondary | ICD-10-CM | POA: Diagnosis not present

## 2015-06-22 DIAGNOSIS — J45990 Exercise induced bronchospasm: Secondary | ICD-10-CM

## 2015-06-22 DIAGNOSIS — Z8249 Family history of ischemic heart disease and other diseases of the circulatory system: Secondary | ICD-10-CM

## 2015-06-22 DIAGNOSIS — E785 Hyperlipidemia, unspecified: Secondary | ICD-10-CM

## 2015-06-22 DIAGNOSIS — G8929 Other chronic pain: Secondary | ICD-10-CM | POA: Diagnosis not present

## 2015-06-22 LAB — COMPREHENSIVE METABOLIC PANEL
ALK PHOS: 43 U/L (ref 40–115)
ALT: 18 U/L (ref 9–46)
AST: 27 U/L (ref 10–40)
Albumin: 4.4 g/dL (ref 3.6–5.1)
BUN: 17 mg/dL (ref 7–25)
CO2: 27 mmol/L (ref 20–31)
Calcium: 9.1 mg/dL (ref 8.6–10.3)
Chloride: 105 mmol/L (ref 98–110)
Creat: 0.85 mg/dL (ref 0.60–1.35)
Glucose, Bld: 90 mg/dL (ref 65–99)
POTASSIUM: 4.3 mmol/L (ref 3.5–5.3)
Sodium: 139 mmol/L (ref 135–146)
TOTAL PROTEIN: 6.5 g/dL (ref 6.1–8.1)
Total Bilirubin: 0.9 mg/dL (ref 0.2–1.2)

## 2015-06-22 LAB — CBC WITH DIFFERENTIAL/PLATELET
BASOS ABS: 0 10*3/uL (ref 0.0–0.1)
Basophils Relative: 0 % (ref 0–1)
Eosinophils Absolute: 0.2 10*3/uL (ref 0.0–0.7)
Eosinophils Relative: 3 % (ref 0–5)
HEMATOCRIT: 39.6 % (ref 39.0–52.0)
Hemoglobin: 12.9 g/dL — ABNORMAL LOW (ref 13.0–17.0)
LYMPHS ABS: 2 10*3/uL (ref 0.7–4.0)
LYMPHS PCT: 40 % (ref 12–46)
MCH: 26.5 pg (ref 26.0–34.0)
MCHC: 32.6 g/dL (ref 30.0–36.0)
MCV: 81.3 fL (ref 78.0–100.0)
MPV: 9.4 fL (ref 8.6–12.4)
Monocytes Absolute: 0.4 10*3/uL (ref 0.1–1.0)
Monocytes Relative: 8 % (ref 3–12)
NEUTROS PCT: 49 % (ref 43–77)
Neutro Abs: 2.5 10*3/uL (ref 1.7–7.7)
Platelets: 246 10*3/uL (ref 150–400)
RBC: 4.87 MIL/uL (ref 4.22–5.81)
RDW: 14.3 % (ref 11.5–15.5)
WBC: 5.1 10*3/uL (ref 4.0–10.5)

## 2015-06-22 LAB — LIPID PANEL
CHOL/HDL RATIO: 2.6 ratio (ref <=5.0)
CHOLESTEROL: 153 mg/dL (ref 125–200)
HDL: 58 mg/dL (ref >=40)
LDL Cholesterol: 86 mg/dL (ref <130)
Triglycerides: 43 mg/dL (ref <150)
VLDL: 9 mg/dL (ref <30)

## 2015-06-22 MED ORDER — ATORVASTATIN CALCIUM 40 MG PO TABS: 40.0000 mg | ORAL_TABLET | Freq: Every day | ORAL | Status: DC

## 2015-06-22 NOTE — Progress Notes (Signed)
Subjective:    Patient ID: Travis Hoffman, male    DOB: [birth year], 49 y.o.   MRN: [id number]  HPI He is here for complete examination. He has had difficulty with neurologic symptoms referable to the ulnar nerve on the right. He has learned to make accommodations in regards to this and is doing well with this. He has also noted visual changes requiring him to a reading glasses. He also complains of some abdominal discomfort described as sharp pain especially when he has an empty stomach. He's had no nausea, vomiting, diarrhea, black tarry stools. He has had difficulty with exertional headaches in the past but has learned to change his exercise routine.He does have a previous history of exercise-induced asthma but has not had the need for albuterol in quite some time. He still does have some neck pain especially with cycling. Apparently his low back is not giving him trouble.Otherwise things are going quite well for him. He continues on Lipitor and has no difficulty with this.Social and family history as well as health maintenance and immunizations were reviewed.   Review of Systems  All other systems reviewed and are negative.      Objective:   Physical Exam BP 108/80 mmHg  Pulse 60  Temp(Src) 97.6 F (36.4 C) (Oral)  Resp 12  Ht 6' (1.829 m)  Wt 176 lb 6.4 oz (80.015 kg)  BMI 23.92 kg/m2  General Appearance:    Alert, cooperative, no distress, appears stated age  Head:    Normocephalic, without obvious abnormality, atraumatic  Eyes:    PERRL, conjunctiva/corneas clear, EOM's intact, fundi    benign  Ears:    Normal TM's and external ear canals  Nose:   Nares normal, mucosa normal, no drainage or sinus   tenderness  Throat:   Lips, mucosa, and tongue normal; teeth and gums normal  Neck:   Supple, no lymphadenopathy;  thyroid:  no   enlargement/tenderness/nodules; no carotid   bruit or JVD  Back:    Spine nontender, no curvature, ROM normal, no CVA     tenderness  Lungs:     Clear  to auscultation bilaterally without wheezes, rales or     ronchi; respirations unlabored  Chest Wall:    No tenderness or deformity   Heart:    Regular rate and rhythm, S1 and S2 normal, no murmur, rub   or gallop  Breast Exam:    No chest wall tenderness, masses or gynecomastia  Abdomen:     Soft, non-tender, nondistended, normoactive bowel sounds,    no masses, no hepatosplenomegaly  Genitalia:    Normal male external genitalia without lesions.  Testicles without masses.  No inguinal hernias.     Extremities:   No clubbing, cyanosis or edema  Pulses:   2+ and symmetric all extremities  Skin:   Skin color, texture, turgor normal, no rashes or lesions  Lymph nodes:   Cervical, supraclavicular, and axillary nodes normal  Neurologic:   CNII-XII intact, normal strength, sensation and gait; reflexes 2+ and symmetric throughout          Psych:   Normal mood, affect, hygiene and grooming.          Assessment & Plan:  Routine general medical examination at a health care facility - Plan: CBC with Differential/Platelet, Comprehensive metabolic panel, Lipid panel  Chronic low back pain  Family history of heart disease in male family member before age 53 - Plan: atorvastatin (LIPITOR) 40 MG tablet  Exercise-induced asthma  Hyperlipidemia LDL goal <100 - Plan: atorvastatin (LIPITOR) 40 MG tablet, Lipid panel Discussed the abdominal pain and recommend Prilosec. If he has continued difficulty, he will call me. He will continue on his present medications.

## 2015-06-22 NOTE — Patient Instructions (Signed)
2 Prilosec daily for the next week and see what that does

## 2015-10-20 ENCOUNTER — Encounter: Payer: Self-pay | Admitting: Family Medicine

## 2016-07-10 ENCOUNTER — Encounter: Payer: Self-pay | Admitting: Family Medicine

## 2016-07-10 ENCOUNTER — Ambulatory Visit (INDEPENDENT_AMBULATORY_CARE_PROVIDER_SITE_OTHER): Payer: BLUE CROSS/BLUE SHIELD | Admitting: Family Medicine

## 2016-07-10 VITALS — BP 110/80 | HR 60 | Resp 16 | Ht 72.5 in | Wt 174.0 lb

## 2016-07-10 DIAGNOSIS — E785 Hyperlipidemia, unspecified: Secondary | ICD-10-CM | POA: Insufficient documentation

## 2016-07-10 DIAGNOSIS — J45990 Exercise induced bronchospasm: Secondary | ICD-10-CM

## 2016-07-10 DIAGNOSIS — Z8249 Family history of ischemic heart disease and other diseases of the circulatory system: Secondary | ICD-10-CM

## 2016-07-10 DIAGNOSIS — Z0000 Encounter for general adult medical examination without abnormal findings: Secondary | ICD-10-CM | POA: Diagnosis not present

## 2016-07-10 DIAGNOSIS — M1990 Unspecified osteoarthritis, unspecified site: Secondary | ICD-10-CM | POA: Diagnosis not present

## 2016-07-10 DIAGNOSIS — Z630 Problems in relationship with spouse or partner: Secondary | ICD-10-CM

## 2016-07-10 DIAGNOSIS — Z23 Encounter for immunization: Secondary | ICD-10-CM | POA: Diagnosis not present

## 2016-07-10 LAB — POCT URINALYSIS DIPSTICK
Bilirubin, UA: NEGATIVE
Blood, UA: NEGATIVE
Glucose, UA: NEGATIVE
KETONES UA: NEGATIVE
Leukocytes, UA: NEGATIVE
Nitrite, UA: NEGATIVE
PH UA: 6
Protein, UA: NEGATIVE
Spec Grav, UA: >=1.03
Urobilinogen, UA: NEGATIVE

## 2016-07-10 MED ORDER — ATORVASTATIN CALCIUM 40 MG PO TABS: 40.0000 mg | ORAL_TABLET | Freq: Every day | ORAL | 3 refills | Status: DC

## 2016-07-10 NOTE — Progress Notes (Signed)
Subjective:    Patient ID: Travis Hoffman, male    DOB: 09/15/1966, 50 y.o.   MRN: [id number]  HPI He is here for complete examination. He did stop taking his statin when he went on vacation and has not started back on it. This was one month ago. He had no complaints while being on the medication. There is a family history of heart disease. He also does have exercise-induced asthma but only when he runs. He does exercise regularly usually riding a bicycle. He does complain of continued difficulty with stiffness in his fingers that usually goes away fairly quickly. This is been going on for quite some time. He and his wife are having some marital issues. They're apparently now talking and trying to work this out themselves. He states that now the children are gone that there is difficulty with a 49 year old daughter having some psych issues. He has had no chest pain, shortness of breath, GI or GU issues.   Review of Systems  All other systems reviewed and are negative.      Objective:   Physical Exam BP 110/80   Pulse 60   Resp 16   Ht 6' 0.5" (1.842 m)   Wt 174 lb (78.9 kg)   BMI 23.27 kg/m   General Appearance:    Alert, cooperative, no distress, appears stated age  Head:    Normocephalic, without obvious abnormality, atraumatic  Eyes:    PERRL, conjunctiva/corneas clear, EOM's intact, fundi    benign  Ears:    Normal TM's and external ear canals  Nose:   Nares normal, mucosa normal, no drainage or sinus   tenderness  Throat:   Lips, mucosa, and tongue normal; teeth and gums normal  Neck:   Supple, no lymphadenopathy;  thyroid:  no   enlargement/tenderness/nodules; no carotid   bruit or JVD  Back:    Spine nontender, no curvature, ROM normal, no CVA     tenderness  Lungs:     Clear to auscultation bilaterally without wheezes, rales or     ronchi; respirations unlabored      Heart:    Regular rate and rhythm, S1 and S2 normal, no murmur, rub   or gallop     Abdomen:     Soft,  non-tender, nondistended, normoactive bowel sounds,    no masses, no hepatosplenomegaly  Genitalia:    Normal male external genitalia without lesions.  Testicles without masses.  No inguinal hernias.  Rectal:    Deferred .  Extremities:   No clubbing, cyanosis or edema  Pulses:   2+ and symmetric all extremities  Skin:   Skin color, texture, turgor normal, no rashes or lesions  Lymph nodes:   Cervical, supraclavicular, and axillary nodes normal  Neurologic:   CNII-XII intact, normal strength, sensation and gait; reflexes 2+ and symmetric throughout          Psych:   Normal mood, affect, hygiene and grooming.          Assessment & Plan:  Annual physical exam - Plan: Urinalysis Dipstick, Visual acuity screening, CBC with Differential/Platelet, Comprehensive metabolic panel, Lipid panel  Need for prophylactic vaccination and inoculation against influenza - Plan: Flu Vaccine QUAD 36+ mos IM  Family history of heart disease in male family member before age 49 - Plan: Lipid panel, atorvastatin (LIPITOR) 40 MG tablet  Exercise-induced asthma  Hyperlipidemia LDL goal <100 - Plan: Lipid panel, atorvastatin (LIPITOR) 40 MG tablet  Marital stress  Arthritis He will  return here in about 2 months for routine blood work. His immunizations were updated. Discussed the st.ress that he is under with the marriage and strongly encouraged him to get involved with counseling. When he turns 51 discussed getting colon  Cancer screening. Return here in 2 months for more accurate blood work.

## 2016-08-29 ENCOUNTER — Telehealth: Payer: Self-pay | Admitting: Family Medicine

## 2016-08-29 ENCOUNTER — Other Ambulatory Visit: Payer: Self-pay | Admitting: Family Medicine

## 2016-08-29 MED ORDER — SILDENAFIL CITRATE 100 MG PO TABS: 50.0000 mg | ORAL_TABLET | Freq: Every day | ORAL | 1 refills | Status: DC | PRN

## 2016-08-29 NOTE — Telephone Encounter (Signed)
Explain that the CPE is free but any eval and mamagement is billed separately

## 2016-08-29 NOTE — Telephone Encounter (Signed)
Explained same to patient.  Advised pt of rx refll

## 2016-08-29 NOTE — Telephone Encounter (Signed)
Pt called concerned as to why he had a bill of 42.93 since his cpe was free.  Tried to explain that to him.  Also pt [facility] sample or [facility].  Please advise pt [phone].

## 2016-09-02 ENCOUNTER — Telehealth: Payer: Self-pay | Admitting: Family Medicine

## 2016-09-02 NOTE — Telephone Encounter (Signed)
P.A. VIAGRA  °

## 2016-09-09 ENCOUNTER — Other Ambulatory Visit: Payer: BLUE CROSS/BLUE SHIELD

## 2016-09-09 DIAGNOSIS — E785 Hyperlipidemia, unspecified: Secondary | ICD-10-CM

## 2016-09-09 DIAGNOSIS — Z8249 Family history of ischemic heart disease and other diseases of the circulatory system: Secondary | ICD-10-CM

## 2016-09-09 DIAGNOSIS — Z0000 Encounter for general adult medical examination without abnormal findings: Secondary | ICD-10-CM

## 2016-09-09 LAB — COMPREHENSIVE METABOLIC PANEL
ALBUMIN: 4.4 g/dL (ref 3.6–5.1)
ALT: 20 U/L (ref 9–46)
AST: 34 U/L (ref 10–40)
Alkaline Phosphatase: 47 U/L (ref 40–115)
BILIRUBIN TOTAL: 0.8 mg/dL (ref 0.2–1.2)
BUN: 22 mg/dL (ref 7–25)
CO2: 27 mmol/L (ref 20–31)
CREATININE: 1.12 mg/dL (ref 0.60–1.35)
Calcium: 9.1 mg/dL (ref 8.6–10.3)
Chloride: 102 mmol/L (ref 98–110)
GLUCOSE: 95 mg/dL (ref 65–99)
Potassium: 4.4 mmol/L (ref 3.5–5.3)
SODIUM: 137 mmol/L (ref 135–146)
Total Protein: 6.7 g/dL (ref 6.1–8.1)

## 2016-09-09 LAB — CBC WITH DIFFERENTIAL/PLATELET
Basophils Absolute: 0 cells/uL (ref 0–200)
Basophils Relative: 0 %
EOS PCT: 4 %
Eosinophils Absolute: 228 cells/uL (ref 15–500)
HCT: 40.7 % (ref 38.5–50.0)
HEMOGLOBIN: 13.2 g/dL (ref 13.2–17.1)
LYMPHS ABS: 2166 {cells}/uL (ref 850–3900)
Lymphocytes Relative: 38 %
MCH: 26.3 pg — ABNORMAL LOW (ref 27.0–33.0)
MCHC: 32.4 g/dL (ref 32.0–36.0)
MCV: 81.1 fL (ref 80.0–100.0)
MPV: 9.7 fL (ref 7.5–12.5)
Monocytes Absolute: 513 cells/uL (ref 200–950)
Monocytes Relative: 9 %
NEUTROS ABS: 2793 {cells}/uL (ref 1500–7800)
NEUTROS PCT: 49 %
Platelets: 248 10*3/uL (ref 140–400)
RBC: 5.02 MIL/uL (ref 4.20–5.80)
RDW: 14.4 % (ref 11.0–15.0)
WBC: 5.7 10*3/uL (ref 4.0–10.5)

## 2016-09-09 LAB — LIPID PANEL
Cholesterol: 184 mg/dL (ref <200)
HDL: 64 mg/dL (ref >40)
LDL CALC: 110 mg/dL — AB (ref <100)
TRIGLYCERIDES: 50 mg/dL (ref <150)
Total CHOL/HDL Ratio: 2.9 Ratio (ref <5.0)
VLDL: 10 mg/dL (ref <30)

## 2016-09-16 ENCOUNTER — Telehealth: Payer: Self-pay | Admitting: Family Medicine

## 2016-09-16 NOTE — Telephone Encounter (Signed)
Pt called and left message for his labs to be emailed to [email or web address].  Done

## 2016-09-24 ENCOUNTER — Telehealth: Payer: Self-pay | Admitting: Family Medicine

## 2016-09-24 NOTE — Telephone Encounter (Signed)
Pt had requested copy of labs to be sent, which I did.  Pt replied to tell Dr. Susann GivensLalonde that he will work on his diet to reduce the LDL and stick with the same meds.

## 2016-09-30 ENCOUNTER — Telehealth: Payer: Self-pay | Admitting: Family Medicine

## 2016-09-30 DIAGNOSIS — J45990 Exercise induced bronchospasm: Secondary | ICD-10-CM

## 2016-09-30 NOTE — Telephone Encounter (Signed)
Pt needs refill Xopenex inhaler for exercise induced asthma, he needs it with working out and winter to [facility]

## 2016-09-30 NOTE — Telephone Encounter (Signed)
Never received response so I called t# [phone] & did P.A. Over the phone & it was approved til 09/30/17 case ID# [id number].  Called pharmacy & went thru for $10 for #5 pills.  Called pt & informed

## 2016-10-01 ENCOUNTER — Telehealth: Payer: Self-pay | Admitting: Family Medicine

## 2016-10-01 MED ORDER — ALBUTEROL SULFATE HFA 108 (90 BASE) MCG/ACT IN AERS: 2.0000 | INHALATION_SPRAY | Freq: Four times a day (QID) | RESPIRATORY_TRACT | 1 refills | Status: DC | PRN

## 2016-10-01 NOTE — Telephone Encounter (Signed)
Let him know that I called the medicine in 

## 2016-10-01 NOTE — Telephone Encounter (Signed)
Pt just sent me an email that states Xopenex is not covered by his insurance.  However Proair HFA and Ventolin HFA AER is covered.  Please advise pt.

## 2016-10-01 NOTE — Telephone Encounter (Signed)
Pt called and left message that he needs another inhaler.  He doesn't remember the name.  Please let pt know if ok'd [phone]

## 2016-10-11 ENCOUNTER — Ambulatory Visit: Payer: BLUE CROSS/BLUE SHIELD | Admitting: Family Medicine

## 2017-05-14 ENCOUNTER — Telehealth: Payer: Self-pay | Admitting: Family Medicine

## 2017-05-14 ENCOUNTER — Encounter: Payer: Self-pay | Admitting: Family Medicine

## 2017-05-14 NOTE — Telephone Encounter (Signed)
Pt said he sent an email about his statin med. He wants to know if he can be switched back to Crestor from Lipitor.

## 2017-05-15 MED ORDER — ROSUVASTATIN CALCIUM 20 MG PO TABS: 20.0000 mg | ORAL_TABLET | Freq: Every day | ORAL | 3 refills | Status: DC

## 2017-05-15 NOTE — Telephone Encounter (Signed)
Let him know that I switch him to Crestor but find out what his reasoning is.

## 2017-05-15 NOTE — Telephone Encounter (Signed)
Left word for word message  

## 2017-07-28 ENCOUNTER — Encounter: Payer: Self-pay | Admitting: Family Medicine

## 2017-07-28 ENCOUNTER — Ambulatory Visit (INDEPENDENT_AMBULATORY_CARE_PROVIDER_SITE_OTHER): Payer: BLUE CROSS/BLUE SHIELD | Admitting: Family Medicine

## 2017-07-28 VITALS — BP 110/62 | HR 50 | Ht 72.0 in | Wt 170.2 lb

## 2017-07-28 DIAGNOSIS — M1990 Unspecified osteoarthritis, unspecified site: Secondary | ICD-10-CM | POA: Diagnosis not present

## 2017-07-28 DIAGNOSIS — Z8249 Family history of ischemic heart disease and other diseases of the circulatory system: Secondary | ICD-10-CM

## 2017-07-28 DIAGNOSIS — Z0000 Encounter for general adult medical examination without abnormal findings: Secondary | ICD-10-CM | POA: Diagnosis not present

## 2017-07-28 DIAGNOSIS — Z23 Encounter for immunization: Secondary | ICD-10-CM

## 2017-07-28 DIAGNOSIS — E785 Hyperlipidemia, unspecified: Secondary | ICD-10-CM

## 2017-07-28 LAB — CBC WITH DIFFERENTIAL/PLATELET
BASOS PCT: 0.4 %
Basophils Absolute: 40 cells/uL (ref 0–200)
EOS PCT: 0.6 %
Eosinophils Absolute: 60 cells/uL (ref 15–500)
HEMATOCRIT: 39.9 % (ref 38.5–50.0)
HEMOGLOBIN: 12.9 g/dL — AB (ref 13.2–17.1)
LYMPHS ABS: 1300 {cells}/uL (ref 850–3900)
MCH: 26.4 pg — ABNORMAL LOW (ref 27.0–33.0)
MCHC: 32.3 g/dL (ref 32.0–36.0)
MCV: 81.8 fL (ref 80.0–100.0)
MPV: 10.7 fL (ref 7.5–12.5)
Monocytes Relative: 6.1 %
NEUTROS ABS: 7990 {cells}/uL — AB (ref 1500–7800)
Neutrophils Relative %: 79.9 %
Platelets: 241 10*3/uL (ref 140–400)
RBC: 4.88 10*6/uL (ref 4.20–5.80)
RDW: 12.7 % (ref 11.0–15.0)
Total Lymphocyte: 13 %
WBC: 10 10*3/uL (ref 3.8–10.8)
WBCMIX: 610 {cells}/uL (ref 200–950)

## 2017-07-28 LAB — POCT URINALYSIS DIP (PROADVANTAGE DEVICE)
BILIRUBIN UA: NEGATIVE
Glucose, UA: NEGATIVE mg/dL
Ketones, POC UA: NEGATIVE mg/dL
Leukocytes, UA: NEGATIVE
NITRITE UA: NEGATIVE
Protein Ur, POC: NEGATIVE mg/dL
RBC UA: NEGATIVE
SPECIFIC GRAVITY, URINE: 1.03
Urobilinogen, Ur: NEGATIVE
pH, UA: 6 (ref 5.0–8.0)

## 2017-07-28 LAB — COMPREHENSIVE METABOLIC PANEL
AG RATIO: 1.7 (calc) (ref 1.0–2.5)
ALBUMIN MSPROF: 4.4 g/dL (ref 3.6–5.1)
ALT: 20 U/L (ref 9–46)
AST: 26 U/L (ref 10–35)
Alkaline phosphatase (APISO): 47 U/L (ref 40–115)
BILIRUBIN TOTAL: 0.9 mg/dL (ref 0.2–1.2)
BUN: 19 mg/dL (ref 7–25)
CALCIUM: 9.6 mg/dL (ref 8.6–10.3)
CO2: 27 mmol/L (ref 20–32)
Chloride: 102 mmol/L (ref 98–110)
Creat: 1.01 mg/dL (ref 0.70–1.33)
Globulin: 2.6 g/dL (calc) (ref 1.9–3.7)
Glucose, Bld: 73 mg/dL (ref 65–99)
POTASSIUM: 4.6 mmol/L (ref 3.5–5.3)
SODIUM: 138 mmol/L (ref 135–146)
TOTAL PROTEIN: 7 g/dL (ref 6.1–8.1)

## 2017-07-28 LAB — LIPID PANEL
CHOL/HDL RATIO: 2.9 (calc) (ref <5.0)
Cholesterol: 167 mg/dL (ref <200)
HDL: 57 mg/dL (ref >40)
LDL CHOLESTEROL (CALC): 95 mg/dL
Non-HDL Cholesterol (Calc): 110 mg/dL (calc) (ref <130)
Triglycerides: 65 mg/dL (ref <150)

## 2017-07-28 MED ORDER — ROSUVASTATIN CALCIUM 20 MG PO TABS: 20.0000 mg | ORAL_TABLET | Freq: Every day | ORAL | 3 refills | Status: DC

## 2017-07-28 NOTE — Progress Notes (Signed)
Subjective:    Patient ID: Travis Hoffman, male    DOB: 07/13/1966, 51 y.o.   MRN: [id number]  HPI He is here for complete examination. He does have questions about a particular chemical that he found could be related to heart disease. He showed me information and I explained that I was not at all from it with that particular medication. He is now on a diet very similar to the Mediterranean diet and I encouraged him to continue. He continues on Crestor. In the past she had had questions about whether the statin was really causing some leg symptoms especially since he is an avid cyclist. He is not sure whether that has made much of a difference. 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 Does have a previous history of arthritic symptoms but none recently. Has not had the need for Viagra anytime recently. Work and home life going well. Family and social history as well as health maintenance and immunizations were reviewed   Review of Systems  All other systems reviewed and are negative.      Objective:   Physical Exam BP 110/62   Pulse (!) 50   Ht 6' (1.829 m)   Wt 170 lb 3.2 oz (77.2 kg)   BMI 23.08 kg/m   General Appearance:    Alert, cooperative, no distress, appears stated age  Head:    Normocephalic, without obvious abnormality, atraumatic  Eyes:    PERRL, conjunctiva/corneas clear, EOM's intact, fundi    benign  Ears:    Normal TM's and external ear canals  Nose:   Nares normal, mucosa normal, no drainage or sinus   tenderness  Throat:   Lips, mucosa, and tongue normal; teeth and gums normal  Neck:   Supple, no lymphadenopathy;  thyroid:  no   enlargement/tenderness/nodules; no carotid   bruit or JVD     Lungs:     Clear to auscultation bilaterally without wheezes, rales or     ronchi; respirations unlabored      Heart:    Regular rate and rhythm, S1 and S2 normal, no murmur, rub   or gallop     Abdomen:     Soft, non-tender, nondistended,  normoactive bowel sounds,    no masses, no hepatosplenomegaly  Genitalia:    Normal male external genitalia without lesions.  Testicles without masses.  No inguinal hernias.  Rectal:  Deferred   Extremities:   No clubbing, cyanosis or edema  Pulses:   2+ and symmetric all extremities  Skin:   Skin color, texture, turgor normal, no rashes or lesions  Lymph nodes:   Cervical, supraclavicular, and axillary nodes normal  Neurologic:   CNII-XII intact, normal strength, sensation and gait; reflexes 2+ and symmetric throughout          Psych:   Normal mood, affect, hygiene and grooming.    EKG shows a sinus bradycardia and probable LVH which is most likely secondary to his very active life in regard to running and cycling.      Assessment & Plan:  Routine general medical examination at a health care facility - Plan: POCT Urinalysis DIP (Proadvantage Device), EKG 12-Lead, CBC with Differential/Platelet, Comprehensive metabolic panel, Lipid panel  Need for influenza vaccination - Plan: Flu Vaccine QUAD 6+ mos PF IM (Fluarix Quad PF)  Hyperlipidemia LDL goal <100 - Plan: Lipid panel  Family history of heart disease in male family member before age 26 - Plan: EKG 12-Lead, CBC with Differential/Platelet,  Comprehensive metabolic panel, Lipid panel  Arthritis Encouraged him to continue remained very physically active. Explained that the EKG was of no major concern. Cristal Deer will be renewed.

## 2017-08-24 ENCOUNTER — Encounter: Payer: Self-pay | Admitting: Family Medicine

## 2017-08-24 DIAGNOSIS — J45990 Exercise induced bronchospasm: Secondary | ICD-10-CM

## 2017-08-25 MED ORDER — ALBUTEROL SULFATE HFA 108 (90 BASE) MCG/ACT IN AERS: 2.0000 | INHALATION_SPRAY | Freq: Four times a day (QID) | RESPIRATORY_TRACT | 1 refills | Status: DC | PRN

## 2017-09-26 ENCOUNTER — Encounter: Payer: Self-pay | Admitting: Family Medicine

## 2017-10-02 ENCOUNTER — Encounter: Payer: Self-pay | Admitting: Family Medicine

## 2017-10-13 NOTE — Telephone Encounter (Signed)
Letter typed & waiting Dr. Delford FieldKnapp's signature

## 2017-10-20 NOTE — Telephone Encounter (Signed)
I typed letter and Dr. Lynelle DoctorKnapp signed & Per Suzette BattiestVeronica she spoke with father and mailed letter

## 2017-10-29 NOTE — Telephone Encounter (Signed)
done

## 2017-10-31 ENCOUNTER — Telehealth: Payer: Self-pay | Admitting: Family Medicine

## 2017-10-31 NOTE — Telephone Encounter (Signed)
  Patient called and wants to have  Cardiac CT for calcium scoring He wants it for screening purposes and he knows that his insurance may not pay for it. He has already [facility] and they gave him a price but could not schedule without orders from you  He is wanting to try to avoid coming in for appointment prior to avoid additional costs and that he typically discusses these type things with you at his annual visit

## 2017-10-31 NOTE — Telephone Encounter (Signed)
Go ahead and order it.

## 2017-11-03 ENCOUNTER — Other Ambulatory Visit: Payer: Self-pay

## 2017-11-03 DIAGNOSIS — Z136 Encounter for screening for cardiovascular disorders: Secondary | ICD-10-CM

## 2017-11-03 DIAGNOSIS — Q991 46, XX true hermaphrodite: Secondary | ICD-10-CM

## 2017-11-03 NOTE — Telephone Encounter (Signed)
Put order in spoke w/  imaging they will call to set up scan with pt.

## 2017-11-05 ENCOUNTER — Ambulatory Visit
Admission: RE | Admit: 2017-11-05 | Discharge: 2017-11-05 | Disposition: A | Discharge status: Home / Self Care | Payer: No Typology Code available for payment source | Source: Ambulatory Visit | Attending: Family Medicine | Admitting: Family Medicine

## 2017-11-05 DIAGNOSIS — Z136 Encounter for screening for cardiovascular disorders: Secondary | ICD-10-CM

## 2017-11-05 DIAGNOSIS — Q991 46, XX true hermaphrodite: Secondary | ICD-10-CM

## 2017-11-13 ENCOUNTER — Encounter: Payer: Self-pay | Admitting: Family Medicine

## 2017-12-12 ENCOUNTER — Encounter: Payer: Self-pay | Admitting: Family Medicine

## 2017-12-12 LAB — HEMOGLOBIN A1C: Hemoglobin A1C: 5.4

## 2017-12-30 ENCOUNTER — Encounter: Payer: Self-pay | Admitting: Family Medicine

## 2017-12-31 ENCOUNTER — Encounter: Payer: Self-pay | Admitting: Family Medicine

## 2018-01-01 ENCOUNTER — Encounter: Payer: Self-pay | Admitting: Family Medicine

## 2018-01-12 ENCOUNTER — Encounter: Payer: Self-pay | Admitting: Family Medicine

## 2018-01-13 ENCOUNTER — Encounter: Payer: Self-pay | Admitting: Family Medicine

## 2018-01-19 ENCOUNTER — Ambulatory Visit (INDEPENDENT_AMBULATORY_CARE_PROVIDER_SITE_OTHER): Payer: BLUE CROSS/BLUE SHIELD | Admitting: Family Medicine

## 2018-01-19 ENCOUNTER — Encounter: Payer: Self-pay | Admitting: Family Medicine

## 2018-01-19 VITALS — BP 102/68 | HR 54 | Temp 97.5°F | Ht 73.0 in | Wt 172.2 lb

## 2018-01-19 DIAGNOSIS — E785 Hyperlipidemia, unspecified: Secondary | ICD-10-CM

## 2018-01-19 DIAGNOSIS — Z8249 Family history of ischemic heart disease and other diseases of the circulatory system: Secondary | ICD-10-CM

## 2018-01-19 NOTE — Progress Notes (Signed)
   Subjective:    Patient ID: Travis FusiDavid Booton, male    DOB: 01/29/1966, 52 y.o.   MRN: [id number]  HPI He is here for consultation concerning risk factors for heart disease.  He does have family history of heart disease with his father having it before age 755.  He does not smoke, blood pressure is under good control.  No evidence of diabetes.  He continues on Crestor.  He has made some dietary adjustments in his last lipid panel showed an elevated LDL which he blames on his change in diet.  He is also had a cardiac score which is elevated.  He has done blood work which did show an elevated lipoprotein A1 which she has a lot of concerns about.  He has questions concerning how to lower this.  He also has a low vitamin D.  He has done a fair amount of research concerning his lipid panel.   Review of Systems     Objective:   Physical Exam Alert and in no distress otherwise not examined      Assessment & Plan:  Family history of heart disease in male family member before age 51 - Plan: Ambulatory referral to Cardiology  Hyperlipidemia LDL goal <100 - Plan: Ambulatory referral to Cardiology, Ambulatory referral to Endocrinology I discussed the family history with him as well as his risk factors.  I explained that the cholesterol numbers of the only things that can truly be dealt with since he has other risk factors under good control.  I will refer him to cardiology for further risk assessment.  Also discussed some of the issues dealing with LDL cholesterol, lipoprotein and risk factors as well as potential modification of this.  He plans to continue on his present Crestor although he is interested in potentially lowering this.  He will continue on his omega-3.  Did mention the possibility of using Lovaza rather than the over-the-counter varieties.  We will also refer him to Dr. Juleen ChinaKohut to further discuss the many questions he has that I was unable to answer adequately.  Over 25 minutes, the entirety  spent in counseling and coordination of care.

## 2018-01-21 ENCOUNTER — Telehealth: Payer: Self-pay

## 2018-01-21 NOTE — Telephone Encounter (Signed)
Called pt back about referrals. Pt was given the cardiology doctors name and confirmed that he requested to [facility]. KH

## 2018-02-05 DIAGNOSIS — Z79899 Other long term (current) drug therapy: Secondary | ICD-10-CM | POA: Diagnosis not present

## 2018-02-05 DIAGNOSIS — E7800 Pure hypercholesterolemia, unspecified: Secondary | ICD-10-CM | POA: Diagnosis not present

## 2018-02-05 DIAGNOSIS — R931 Abnormal findings on diagnostic imaging of heart and coronary circulation: Secondary | ICD-10-CM | POA: Diagnosis not present

## 2018-02-05 DIAGNOSIS — G729 Myopathy, unspecified: Secondary | ICD-10-CM | POA: Diagnosis not present

## 2018-02-05 DIAGNOSIS — E7841 Elevated Lipoprotein(a): Secondary | ICD-10-CM | POA: Diagnosis not present

## 2018-02-07 DIAGNOSIS — G729 Myopathy, unspecified: Secondary | ICD-10-CM | POA: Insufficient documentation

## 2018-02-26 ENCOUNTER — Ambulatory Visit: Payer: Self-pay | Admitting: Cardiology

## 2018-04-09 DIAGNOSIS — Z79899 Other long term (current) drug therapy: Secondary | ICD-10-CM | POA: Diagnosis not present

## 2018-04-09 DIAGNOSIS — E7800 Pure hypercholesterolemia, unspecified: Secondary | ICD-10-CM | POA: Diagnosis not present

## 2018-05-20 DIAGNOSIS — E7800 Pure hypercholesterolemia, unspecified: Secondary | ICD-10-CM | POA: Diagnosis not present

## 2018-05-20 DIAGNOSIS — E7841 Elevated Lipoprotein(a): Secondary | ICD-10-CM | POA: Diagnosis not present

## 2018-05-20 DIAGNOSIS — R931 Abnormal findings on diagnostic imaging of heart and coronary circulation: Secondary | ICD-10-CM | POA: Diagnosis not present

## 2018-07-06 ENCOUNTER — Encounter: Payer: Self-pay | Admitting: Family Medicine

## 2018-07-24 DIAGNOSIS — Z125 Encounter for screening for malignant neoplasm of prostate: Secondary | ICD-10-CM | POA: Diagnosis not present

## 2018-07-24 DIAGNOSIS — Z0000 Encounter for general adult medical examination without abnormal findings: Secondary | ICD-10-CM | POA: Diagnosis not present

## 2018-07-24 DIAGNOSIS — E7800 Pure hypercholesterolemia, unspecified: Secondary | ICD-10-CM | POA: Diagnosis not present

## 2018-07-24 DIAGNOSIS — E559 Vitamin D deficiency, unspecified: Secondary | ICD-10-CM | POA: Diagnosis not present

## 2018-07-29 ENCOUNTER — Other Ambulatory Visit: Payer: Self-pay | Admitting: Family Medicine

## 2018-07-29 ENCOUNTER — Encounter: Payer: Self-pay | Admitting: Family Medicine

## 2018-07-29 DIAGNOSIS — Z1211 Encounter for screening for malignant neoplasm of colon: Secondary | ICD-10-CM

## 2018-08-20 DIAGNOSIS — E7841 Elevated Lipoprotein(a): Secondary | ICD-10-CM | POA: Diagnosis not present

## 2018-08-20 DIAGNOSIS — E7800 Pure hypercholesterolemia, unspecified: Secondary | ICD-10-CM | POA: Diagnosis not present

## 2018-08-20 DIAGNOSIS — G729 Myopathy, unspecified: Secondary | ICD-10-CM | POA: Diagnosis not present

## 2018-08-20 DIAGNOSIS — R931 Abnormal findings on diagnostic imaging of heart and coronary circulation: Secondary | ICD-10-CM | POA: Diagnosis not present

## 2018-08-20 DIAGNOSIS — Z79899 Other long term (current) drug therapy: Secondary | ICD-10-CM | POA: Diagnosis not present

## 2018-08-25 DIAGNOSIS — E7800 Pure hypercholesterolemia, unspecified: Secondary | ICD-10-CM | POA: Diagnosis not present

## 2018-08-26 ENCOUNTER — Encounter: Payer: Self-pay | Admitting: Family Medicine

## 2018-08-28 ENCOUNTER — Encounter: Payer: Self-pay | Admitting: Family Medicine

## 2018-09-01 ENCOUNTER — Encounter: Payer: Self-pay | Admitting: Family Medicine

## 2018-09-02 DIAGNOSIS — M654 Radial styloid tenosynovitis [de Quervain]: Secondary | ICD-10-CM | POA: Diagnosis not present

## 2018-09-05 DIAGNOSIS — Z1211 Encounter for screening for malignant neoplasm of colon: Secondary | ICD-10-CM | POA: Diagnosis not present

## 2018-09-05 LAB — COLOGUARD: Cologuard: NEGATIVE

## 2018-12-29 ENCOUNTER — Ambulatory Visit: Payer: Self-pay

## 2018-12-29 ENCOUNTER — Ambulatory Visit (INDEPENDENT_AMBULATORY_CARE_PROVIDER_SITE_OTHER): Payer: BLUE CROSS/BLUE SHIELD | Admitting: Sports Medicine

## 2018-12-29 ENCOUNTER — Encounter: Payer: Self-pay | Admitting: Sports Medicine

## 2018-12-29 ENCOUNTER — Other Ambulatory Visit: Payer: Self-pay

## 2018-12-29 VITALS — BP 116/64 | Temp 97.9°F | Ht 72.5 in | Wt 175.0 lb

## 2018-12-29 DIAGNOSIS — M79645 Pain in left finger(s): Secondary | ICD-10-CM | POA: Diagnosis not present

## 2018-12-29 DIAGNOSIS — M654 Radial styloid tenosynovitis [de Quervain]: Secondary | ICD-10-CM | POA: Diagnosis not present

## 2018-12-29 MED ORDER — NITROGLYCERIN 0.2 MG/HR TD PT24: MEDICATED_PATCH | TRANSDERMAL | 1 refills | Status: DC

## 2018-12-29 NOTE — Progress Notes (Signed)
Travis Hoffman - 53 y.o. male MRN [id number]  Date of birth: Sep 15, 1966   Chief complaint: Left thumb pain  SUBJECTIVE:    History of present illness: 52 year old male who presents today with a chief complaint of left dorsal thumb pain.  His symptoms have been present since September and have persisted.  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  Originally, his symptoms began after lifting heavy weights while curling.  He saw Ortho hand who performed x-rays which were negative for arthritis.  They then performed a diagnostic and therapeutic first dorsal compartment blind injection which did provide significant relief for approximately 1 month using steroid.  He has modified his activities by no longer lifting weights and not pressing a shifting bar with his left thumb while cycling.  His symptoms have persisted however.  Today pain is rated 4 out of 10 and is described as manageable.  It is localized to his first dorsal compartment and radiates proximally.  It is worse with gripping motions.  It improves with ice.  No associated numbness or tingling.  No elbow pain.  No new trauma or injury.  He does not wish to have surgery on this and is looking for other treatment alternatives.  Denies rashes or skin lesions.   Review of systems:  Pertinent positives and negatives discussed above in the HPI.   Past medical history: Coronary artery disease, history of gastrointestinal bleeding, dyslipidemia Past surgical history: None Past family history: Coronary artery disease, osteoarthritis Social history: Non-smoker, avid cyclist  Medications: Crestor, magnesium, albuterol inhaler Allergies: No known drug allergies  OBJECTIVE:  Physical exam: Vital signs are reviewed. BP 116/64   Temp 97.9 F (36.6 C) (Oral)   Ht 6' 0.5" (1.842 m)   Wt 175 lb (79.4 kg)   BMI 23.41 kg/m   Gen.: Alert, oriented, appears stated age, in no apparent distress Integumentary: No rashes or ecchymoses Neurologic:   Sensation is intact to light touch C5-T1, negative Tinel's test Psych: Normal affect, mood is described as good Musculoskeletal: Inspection of the left wrist demonstrates a prominent first MCP joint.  Tenderness to palpation over his first dorsal compartment.  No scaphoid tenderness.  Full range of motion of the wrist and thumb.  Strength testing is 5 out of 5 in thumb abduction however painful.  5 out of 5 in thumb extension however painful.  Grip strength is intact.  Positive Finkelstein's test.  Negative Phalen's test.  ULTRASOUND: L wrist Limited diagnostic ultrasound obtained of patient's left dorsal wrist.  -First dorsal compartment is visualized with significant hypoechoic fluid/bull's-eye sign encompassing the abductor pollicis longus and extensor pollicis brevis tendons in the transverse plane.  Longitudinal view demonstrates tendon sheath thickening and nodule formation over top of the first dorsal compartment.  No clear tendon tears. -No evidence of intersection syndrome of the first and second dorsal compartments. -Third through sixth dorsal compartments were visualized.  The fourth dorsal compartment with extensor digitorum tendons had trace hypoechoic fluid.  Otherwise, normal examination of the remaining tendons. -First MCP joint was visualized with trace spurring however no significant joint space narrowing.  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  Ultrasound and interpretation by Dr. Lidia Collum, DO and Sibyl Parr. Fields, MD      ASSESSMENT & PLAN: De Quervain's tenosynovitis, left -Chronic -Limited diagnostic ultrasound was performed today confirming these findings.  Evidence of tendon sheath thickening and nodule formation over the first dorsal compartment.  Bull's-eye sign also seen. -We will prescribe topical nitroglycerin patch per protocol as detailed to the patient -Gentle thumb range of motion  exercises given today.  Will avoid strengthening until 4 to 6 weeks at follow-up given hypoechoic inflammatory fluid as well. -Reviewed treatment options including a repeat steroid injection which will be considered upon follow-up if still symptomatic -Patient has already seen Ortho hand who has recommended surgical decompression of the compartment -Ice or heat as tolerated -Activity modification to avoid excessive abduction or extension of the thumb   Orders Placed This Encounter  Procedures  . [place] LIMITED JOINT SPACE STRUCTURES UP LEFT    Standing Status:   Future    Number of Occurrences:   1    Standing Expiration Date:   02/28/2020    Order Specific Question:   Reason for Exam (SYMPTOM  OR DIAGNOSIS REQUIRED)    Answer:   thumb pain    Order Specific Question:   Preferred imaging location?    Answer:   Internal    Meds ordered this encounter  Medications  . nitroGLYCERIN (NITRODUR - DOSED IN MG/24 HR) 0.2 mg/hr patch    Sig: Use 1/4 patch daily to the affected area    Dispense:  30 patch    Refill:  1      Gustavus Messing, DO Sports Medicine Fellow [place]  I observed and examined the patient with the Dr Laureen Ochs and agree with assessment and plan.  Note reviewed and modified by me. Sterling Big, MD

## 2018-12-29 NOTE — Patient Instructions (Signed)

## 2018-12-29 NOTE — Assessment & Plan Note (Signed)
-  Chronic -Limited diagnostic ultrasound was performed today confirming these findings.  Evidence of tendon sheath thickening and nodule formation over the first dorsal compartment.  Bull's-eye sign also seen. -We will prescribe topical nitroglycerin patch per protocol as detailed to the patient -Gentle thumb range of motion exercises given today.  Will avoid strengthening until 4 to 6 weeks at follow-up given hypoechoic inflammatory fluid as well. -Reviewed treatment options including a repeat steroid injection which will be considered upon follow-up if still symptomatic -Patient has already seen Ortho hand who has recommended surgical decompression of the compartment -Ice or heat as tolerated -Activity modification to avoid excessive abduction or extension of the thumb

## 2019-01-02 ENCOUNTER — Other Ambulatory Visit: Payer: Self-pay | Admitting: Family Medicine

## 2019-01-02 DIAGNOSIS — J45990 Exercise induced bronchospasm: Secondary | ICD-10-CM

## 2019-01-04 NOTE — Telephone Encounter (Signed)
[facility] Is requesting to fill pt albuterol inhaler. Please advise [facility]

## 2019-02-18 ENCOUNTER — Ambulatory Visit: Payer: BLUE CROSS/BLUE SHIELD | Admitting: Sports Medicine

## 2019-03-11 ENCOUNTER — Encounter: Payer: Self-pay | Admitting: Sports Medicine

## 2019-03-11 ENCOUNTER — Ambulatory Visit (INDEPENDENT_AMBULATORY_CARE_PROVIDER_SITE_OTHER): Payer: BLUE CROSS/BLUE SHIELD | Admitting: Sports Medicine

## 2019-03-11 ENCOUNTER — Other Ambulatory Visit: Payer: Self-pay

## 2019-03-11 DIAGNOSIS — M654 Radial styloid tenosynovitis [de Quervain]: Secondary | ICD-10-CM | POA: Diagnosis not present

## 2019-03-11 MED ORDER — METHYLPREDNISOLONE ACETATE 40 MG/ML IJ SUSP
20.0000 mg | Freq: Once | INTRAMUSCULAR | Status: AC
  Administered 2019-03-11: 20 mg via INTRA_ARTICULAR

## 2019-03-11 NOTE — Progress Notes (Signed)
Chronic left DeQuervain's tenosynovitis  Patient has had this since Sept. Saw hand surgeon CSI helped for 4 to 5 weeks Offered surgery but patient did not feel this was bad enough for that  Gradually has come back Still biking and lifting weights and both tend to bother wrist at times  We saw him 3/17 and tried NTG protocol Had classic bulls eye in compartment 1 on [place] Not much improvement since last visit except that he has modified weights and biking position and has less pain with those  ROS No pain in RT wrist No pain in other hand joints No swelling in joints  PE Athletic W M in NAD BP 110/70   Ht 6' 0.5" (1.842 m)   Wt 170 lb (77.1 kg)   BMI 22.74 kg/m   Has full ROM of left wrist No visible swelling Pain with radial deviation mild Mild pain with resiance of wrist or thumb radial extension  Ultrasound of left wrist There is still a classic bulls eye sign in compartment 1 on left No swelling noted on compt 1 on RT CMC, wrist and MCP joints show no effusion Tendon is intact  Summary: consistent findings for DeQuervains tenosynovitis  Procedure:  Injection of left wrist compartment 1 Consent obtained and verified. Time-out conducted. Noted no overlying erythema, induration, or other signs of local infection. Skin prepped in a sterile fashion. Topical analgesic spray: Ethyl chloride.used prior to procedure Completed without difficulty.  We used [place] to identify the swollen tendon sheath and were able to visualize the needle entering the sheath to inject the CS Meds: 1/2 cc methylprednisolone 40 and 1/2 cc lidocaine 1 % Pain immediately improved suggesting accurate placement of the medication. Advised to call if fevers/chills, erythema, induration, drainage, or persistent bleeding.

## 2019-03-11 NOTE — Assessment & Plan Note (Signed)
Lack of response to conservative care  CSI into sheath under [place] today  Wrist compression prn  Modify activity  follow to see result of treatment  Reck 3 months

## 2019-04-22 ENCOUNTER — Ambulatory Visit (INDEPENDENT_AMBULATORY_CARE_PROVIDER_SITE_OTHER): Payer: BC Managed Care – PPO | Admitting: Family Medicine

## 2019-04-22 ENCOUNTER — Encounter: Payer: Self-pay | Admitting: Family Medicine

## 2019-04-22 ENCOUNTER — Other Ambulatory Visit: Payer: Self-pay

## 2019-04-22 VITALS — Temp 97.7°F | Wt 170.0 lb

## 2019-04-22 DIAGNOSIS — N3001 Acute cystitis with hematuria: Secondary | ICD-10-CM | POA: Diagnosis not present

## 2019-04-22 DIAGNOSIS — R309 Painful micturition, unspecified: Secondary | ICD-10-CM | POA: Diagnosis not present

## 2019-04-22 LAB — POCT URINALYSIS DIPSTICK
Bilirubin, UA: NEGATIVE
Glucose, UA: NEGATIVE
Nitrite, UA: POSITIVE
Protein, UA: NEGATIVE
Spec Grav, UA: 1.025 (ref 1.010–1.025)
Urobilinogen, UA: NEGATIVE E.U./dL — AB
pH, UA: 6 (ref 5.0–8.0)

## 2019-04-22 MED ORDER — SULFAMETHOXAZOLE-TRIMETHOPRIM 800-160 MG PO TABS: 1.0000 | ORAL_TABLET | Freq: Two times a day (BID) | ORAL | 0 refills | Status: DC

## 2019-04-22 NOTE — Progress Notes (Signed)
   Subjective:   Documentation for virtual audio and video telecommunications through [place] encounter:  The patient was located at home. 2 patient identifiers used.  The provider was located in the office. The patient did consent to this visit and is aware of possible charges through their insurance for this visit.  The other persons participating in this telemedicine service were none.    Patient ID: Travis Hoffman, male    DOB: Oct 18, 1965, 53 y.o.   MRN: [id number]  HPI Chief Complaint  Patient presents with  . painful urination    uncomfortable urination, saw some blood after urinating, noticied it saturday   His chief complaint is a 5 day history of burning with urination, urinary frequency and malodorous urine. Denies back pain, abdominal pain, urinary retention. Denies sex with new partner. States he is not concerned about having an STD.  He has been drinking more water and cranberry juice.  States his wife has a UTI currently as well.   Complains of a 1 1/2 week history of right cervical lymph node enlargement. He has also had fever with a temperature between 99 and 100 and body aches. No fever today.   States his pulse was also increased 10-15 beats per minute.  He is still awaiting Covid-19 test result. States he had this done on June 30th at [facility]. He did not tell my staff this information and came in earlier today to leave a urine specimen. States he has been staying home and is quarantined.   Denies rhinorrhea, ear pain, sinus pressure, nasal congestion, PND, sore throat, cough, shortness of breath, abdominal pain, N/V/D  Reviewed allergies, medications, past medical, surgical, family, and social history.   Review of Systems Pertinent positives and negatives in the history of present illness.     Objective:   Physical Exam Temp 97.7 F (36.5 C) (Oral)   Wt 170 lb (77.1 kg)   BMI 22.74 kg/m   Alert and oriented and in no acute distress. Respirations unlabored.  Normal speech, mood and thought process.       Assessment & Plan:  Acute cystitis with hematuria - Plan: Urine Culture, Urinalysis Dipstick, GC/Chlamydia Probe Amp([facility]), sulfamethoxazole-trimethoprim (BACTRIM DS) 800-160 MG tablet.   Painful urination - Plan: Urine Culture, Urinalysis Dipstick, GC/Chlamydia Probe Amp([facility])  Discussed that his low grade fever may be related to UTI. Will treat him with antibiotic and send urine for culture and GC/CT. Encouraged him to push fluids. I will also refer him to urology. Dr. Redmond School was consulted regarding this patient and agrees with plan of care.  We will await his Covid-19 testing. Since his fever only broke yesterday, I recommended that he continue with quarantine until fever free for 3 days and negative test result. No need for a repeat test at this time.   Time spent on call was 24 minutes and in review of previous records 2 minutes total.  This virtual service is not related to other E/M service within previous 7 days.

## 2019-04-24 ENCOUNTER — Encounter: Payer: Self-pay | Admitting: Family Medicine

## 2019-04-24 LAB — URINE CULTURE

## 2019-04-27 LAB — GC/CHLAMYDIA PROBE AMP
Chlamydia trachomatis, NAA: NEGATIVE
Neisseria Gonorrhoeae by PCR: NEGATIVE

## 2019-08-01 ENCOUNTER — Encounter: Payer: Self-pay | Admitting: Family Medicine

## 2019-08-04 ENCOUNTER — Encounter: Payer: Self-pay | Admitting: Family Medicine

## 2019-08-04 ENCOUNTER — Ambulatory Visit (INDEPENDENT_AMBULATORY_CARE_PROVIDER_SITE_OTHER): Payer: BC Managed Care – PPO | Admitting: Family Medicine

## 2019-08-04 ENCOUNTER — Other Ambulatory Visit: Payer: Self-pay

## 2019-08-04 VITALS — BP 104/68 | HR 73 | Temp 98.4°F | Ht 72.0 in | Wt 172.6 lb

## 2019-08-04 DIAGNOSIS — J45990 Exercise induced bronchospasm: Secondary | ICD-10-CM

## 2019-08-04 DIAGNOSIS — Z23 Encounter for immunization: Secondary | ICD-10-CM | POA: Diagnosis not present

## 2019-08-04 DIAGNOSIS — E785 Hyperlipidemia, unspecified: Secondary | ICD-10-CM

## 2019-08-04 DIAGNOSIS — Z8249 Family history of ischemic heart disease and other diseases of the circulatory system: Secondary | ICD-10-CM

## 2019-08-04 DIAGNOSIS — Z0000 Encounter for general adult medical examination without abnormal findings: Secondary | ICD-10-CM

## 2019-08-04 MED ORDER — EZETIMIBE 10 MG PO TABS: 10.0000 mg | ORAL_TABLET | Freq: Every day | ORAL | 3 refills | Status: DC

## 2019-08-04 MED ORDER — ROSUVASTATIN CALCIUM 20 MG PO TABS: 20.0000 mg | ORAL_TABLET | Freq: Every day | ORAL | 3 refills | Status: DC

## 2019-08-04 NOTE — Progress Notes (Signed)
   Subjective:    Patient ID: Travis Hoffman, male    DOB: 04-03-1966, 53 y.o.   MRN: [id number]  HPI He is here for complete examination.  He keeps an excellent physical shape with riding a bike.  He does have a family history of heart disease.  He was evaluated at [facility] because of his family history of heart disease and wanted more knowledge concerning risks.  He did get an MR done while he was there.  Presently he is taking Crestor and Zetia as well as OTC meds including omega-3.  He does have exercise-induced asthma but no other major medical problems.  He has no particular concerns or complaints.  Family and social history as well as health maintenance and immunizations was reviewed.   Review of Systems  All other systems reviewed and are negative.      Objective:   Physical Exam Alert and in no distress. Tympanic membranes and canals are normal. Pharyngeal area is normal. Neck is supple without adenopathy or thyromegaly. Cardiac exam shows a regular sinus rhythm without murmurs or gallops. Lungs are clear to auscultation.  Abdominal exam shows normal bowel sounds without masses or tenderness      Assessment & Plan:  Routine general medical examination at a health care facility - Plan: CBC with Differential/Platelet, Comprehensive metabolic panel, Lipid panel, NMR LipoProf + Graph  Need for influenza vaccination - Plan: Flu Vaccine QUAD 6+ mos PF IM (Fluarix Quad PF)  Family history of heart disease in male family member before age 49 - Plan: Lipid panel, NMR LipoProf + Graph, rosuvastatin (CRESTOR) 20 MG tablet, ezetimibe (ZETIA) 10 MG tablet  Exercise-induced asthma  Hyperlipidemia LDL goal <100 Encouraged him to continue to take good care of himself.

## 2019-08-05 LAB — CBC WITH DIFFERENTIAL/PLATELET
Basophils Absolute: 0 10*3/uL (ref 0.0–0.2)
Basos: 1 %
EOS (ABSOLUTE): 0.1 10*3/uL (ref 0.0–0.4)
Eos: 1 %
Hematocrit: 41.2 % (ref 37.5–51.0)
Hemoglobin: 13.7 g/dL (ref 13.0–17.7)
Immature Grans (Abs): 0 10*3/uL (ref 0.0–0.1)
Immature Granulocytes: 0 %
Lymphocytes Absolute: 2.2 10*3/uL (ref 0.7–3.1)
Lymphs: 30 %
MCH: 26.9 pg (ref 26.6–33.0)
MCHC: 33.3 g/dL (ref 31.5–35.7)
MCV: 81 fL (ref 79–97)
Monocytes Absolute: 0.5 10*3/uL (ref 0.1–0.9)
Monocytes: 7 %
Neutrophils Absolute: 4.5 10*3/uL (ref 1.4–7.0)
Neutrophils: 61 %
Platelets: 267 10*3/uL (ref 150–450)
RBC: 5.1 x10E6/uL (ref 4.14–5.80)
RDW: 13.3 % (ref 11.6–15.4)
WBC: 7.4 10*3/uL (ref 3.4–10.8)

## 2019-08-05 LAB — COMPREHENSIVE METABOLIC PANEL
ALT: 26 IU/L (ref 0–44)
AST: 36 IU/L (ref 0–40)
Albumin/Globulin Ratio: 2.4 — ABNORMAL HIGH (ref 1.2–2.2)
Albumin: 4.6 g/dL (ref 3.8–4.9)
Alkaline Phosphatase: 62 IU/L (ref 39–117)
BUN/Creatinine Ratio: 22 — ABNORMAL HIGH (ref 9–20)
BUN: 21 mg/dL (ref 6–24)
Bilirubin Total: 0.9 mg/dL (ref 0.0–1.2)
CO2: 22 mmol/L (ref 20–29)
Calcium: 9.5 mg/dL (ref 8.7–10.2)
Chloride: 101 mmol/L (ref 96–106)
Creatinine, Ser: 0.94 mg/dL (ref 0.76–1.27)
GFR calc Af Amer: 107 mL/min/{1.73_m2} (ref >59)
GFR calc non Af Amer: 93 mL/min/{1.73_m2} (ref >59)
Globulin, Total: 1.9 g/dL (ref 1.5–4.5)
Glucose: 82 mg/dL (ref 65–99)
Potassium: 4.6 mmol/L (ref 3.5–5.2)
Sodium: 136 mmol/L (ref 134–144)
Total Protein: 6.5 g/dL (ref 6.0–8.5)

## 2019-08-05 LAB — NMR LIPOPROF + GRAPH
Cholesterol, Total: 209 mg/dL — ABNORMAL HIGH (ref 100–199)
HDL Particle Number: 41.5 umol/L (ref >=30.5)
HDL-C: 82 mg/dL (ref >39)
LDL Particle Number: 1124 nmol/L — ABNORMAL HIGH (ref <1000)
LDL Size: 20.8 nm (ref >20.5)
LDL-C (NIH Calc): 119 mg/dL — ABNORMAL HIGH (ref 0–99)
LP-IR Score: <25 (ref <=45)
Small LDL Particle Number: 200 nmol/L (ref <=527)
Triglycerides: 44 mg/dL (ref 0–149)

## 2019-08-05 LAB — LIPID PANEL
Chol/HDL Ratio: 2.6 ratio (ref 0.0–5.0)
Cholesterol, Total: 192 mg/dL (ref 100–199)
HDL: 75 mg/dL (ref >39)
LDL Chol Calc (NIH): 110 mg/dL — ABNORMAL HIGH (ref 0–99)
Triglycerides: 34 mg/dL (ref 0–149)
VLDL Cholesterol Cal: 7 mg/dL (ref 5–40)

## 2019-08-06 ENCOUNTER — Other Ambulatory Visit: Payer: Self-pay

## 2019-08-06 DIAGNOSIS — Z20828 Contact with and (suspected) exposure to other viral communicable diseases: Secondary | ICD-10-CM | POA: Diagnosis not present

## 2019-08-06 DIAGNOSIS — Z20822 Contact with and (suspected) exposure to covid-19: Secondary | ICD-10-CM

## 2019-08-07 LAB — NOVEL CORONAVIRUS, NAA: SARS-CoV-2, NAA: NOT DETECTED

## 2019-08-09 ENCOUNTER — Encounter: Payer: Self-pay | Admitting: Family Medicine

## 2019-08-09 ENCOUNTER — Other Ambulatory Visit: Payer: Self-pay

## 2019-08-09 DIAGNOSIS — Z8249 Family history of ischemic heart disease and other diseases of the circulatory system: Secondary | ICD-10-CM

## 2019-08-11 ENCOUNTER — Ambulatory Visit (INDEPENDENT_AMBULATORY_CARE_PROVIDER_SITE_OTHER): Payer: BC Managed Care – PPO | Admitting: Medical

## 2019-08-11 ENCOUNTER — Other Ambulatory Visit: Payer: Self-pay

## 2019-08-11 ENCOUNTER — Encounter: Payer: Self-pay | Admitting: Medical

## 2019-08-11 VITALS — Temp 97.0°F | Ht 72.5 in | Wt 172.0 lb

## 2019-08-11 DIAGNOSIS — J029 Acute pharyngitis, unspecified: Secondary | ICD-10-CM | POA: Diagnosis not present

## 2019-08-11 NOTE — Progress Notes (Signed)
  Subjective:     Patient ID: Travis Hoffman, male   DOB: 1965-11-08, 53 y.o.   MRN: [id number]  This visit type was conducted due to national recommendations for restrictions regarding the COVID-19 Pandemic (e.g. social distancing) in an effort to limit this patient's exposure and mitigate transmission in our community.  Due to their co-morbid illnesses, this patient is at least at moderate risk for complications without adequate follow up.  This format is felt to be most appropriate for this patient at this time.    Documentation for virtual audio and video telecommunications through Zoom encounter:  The patient was located at home. The provider was located in the office. The patient did consent to this visit and is aware of possible charges through their insurance for this visit.  The other persons participating in this telemedicine service were none. Time spent on call was 20 minutes and in review of previous records >20 minutes total.  This virtual service is not related to other E/M service within previous 7 days.   HPI Chief Complaint  Patient presents with  . Sore Throat    x2 days   Virtual consult today for sore throat.  He saw Dr. Redmond School recently for routine med check.  Recently had a negative Covid test in preparation to go see his mom who is in her 63s who has been isolated for months.  He did not want to potentially give her Covid.  He has also been really careful, wearing mask, social visiting, limiting contact with other.  He notes that he went and saw his mother this past weekend but then the next day started having some throat pain on the right, some ear pressure and raw throat.  He sent me pictures through email today of his throat.  He also notes his tongue feels irritated on the right as well.  No fever, no cough, no nausea vomiting, no change in sense of smell or taste, no shortness of breath, no extreme fatigue.  No other symptoms.  No other sick contacts.  No other  aggravating or relieving factors. No other complaint.   Review of Systems As in subjective    Objective:   Physical Exam Due to coronavirus pandemic stay at home measures, patient visit was virtual and they were not examined in person.   There is cobblestoning of his throat, mild erythema based on pictures and video     Assessment:     Encounter Diagnosis  Name Primary?  . Sore throat Yes       Plan:     We discussed his symptoms and concerns.  I suspect a viral sore throat versus postnasal drainage from allergies.  Is careful as he has been, we would favor postnasal drainage, but he notes considerable discomfort of the throat.  We discussed getting a repeat Covid test just to be safe.   He will consider particular if any new symptoms arise in the next 2 days.  For now we discussed supportive care, rest, hydration, salt water gargles, warm fluids, Tylenol for pain.  I placed an order for Covid testing the event he goes to the [facility] test site.  Call or return if new symptoms, worse or not improving.  Travis Hoffman was seen today for sore throat.  Diagnoses and all orders for this visit:  Sore throat -     Novel Coronavirus, NAA ([facility])

## 2019-08-13 ENCOUNTER — Other Ambulatory Visit: Payer: Self-pay

## 2019-08-13 DIAGNOSIS — Z20822 Contact with and (suspected) exposure to covid-19: Secondary | ICD-10-CM

## 2019-08-15 LAB — NOVEL CORONAVIRUS, NAA: SARS-CoV-2, NAA: NOT DETECTED

## 2019-08-20 ENCOUNTER — Encounter: Payer: Self-pay | Admitting: Family Medicine

## 2019-09-10 DIAGNOSIS — Z20828 Contact with and (suspected) exposure to other viral communicable diseases: Secondary | ICD-10-CM | POA: Diagnosis not present

## 2019-09-20 ENCOUNTER — Ambulatory Visit (INDEPENDENT_AMBULATORY_CARE_PROVIDER_SITE_OTHER): Payer: BC Managed Care – PPO | Admitting: Internal Medicine

## 2019-09-20 ENCOUNTER — Encounter: Payer: Self-pay | Admitting: Internal Medicine

## 2019-09-20 ENCOUNTER — Other Ambulatory Visit: Payer: Self-pay

## 2019-09-20 VITALS — BP 120/82 | HR 49 | Temp 97.3°F | Ht 72.5 in | Wt 180.0 lb

## 2019-09-20 DIAGNOSIS — I2510 Atherosclerotic heart disease of native coronary artery without angina pectoris: Secondary | ICD-10-CM | POA: Diagnosis not present

## 2019-09-20 DIAGNOSIS — E785 Hyperlipidemia, unspecified: Secondary | ICD-10-CM | POA: Diagnosis not present

## 2019-09-20 DIAGNOSIS — Z8249 Family history of ischemic heart disease and other diseases of the circulatory system: Secondary | ICD-10-CM

## 2019-09-20 DIAGNOSIS — I517 Cardiomegaly: Secondary | ICD-10-CM

## 2019-09-20 DIAGNOSIS — I2584 Coronary atherosclerosis due to calcified coronary lesion: Secondary | ICD-10-CM

## 2019-09-20 NOTE — Patient Instructions (Addendum)
Medication Instructions:  Your physician recommends that you continue on your current medications as directed. Please refer to the Current Medication list given to you today.  *If you need a refill on your cardiac medications before your next appointment, please call your pharmacy*  Lab Work: NONE   Testing/Procedures: Your physician has requested that you have an echocardiogram. Echocardiography is a painless test that uses sound waves to create images of your heart. It provides your doctor with information about the size and shape of your heart and how well your heart's chambers and valves are working. This procedure takes approximately one hour. There are no restrictions for this procedure. [address]  Follow-Up: At [facility], you and your health needs are our priority.  As part of our continuing mission to provide you with exceptional heart care, we have created designated Provider Care Teams.  These Care Teams include your primary Cardiologist (physician) and Advanced Practice Providers (APPs -  Physician Assistants and Nurse Practitioners) who all work together to provide you with the care you need, when you need it.  Your next appointment:   12 month(s) You will receive a reminder letter in the mail two months in advance. If you don't receive a letter, please call our office to schedule the follow-up appointment.   The format for your next appointment:   In Person  Provider:   You may see DR Margaretann Loveless  or one of the following Advanced Practice Providers on your designated Care Team:    Rosaria Ferries, PA-C  Jory Sims, DNP, ANP  Cadence Kathlen Mody, NP  Your physician recommends that you schedule a follow-up appointment in: DR [place]

## 2019-09-20 NOTE — Progress Notes (Signed)
Cardiology Office Note:    Date:  09/20/2019   ID:  Travis Hoffman, DOB 1966/08/08, MRN [id number]  PCP:  Ronnald Nian, MD  Cardiologist:  No primary care provider on file.  Electrophysiologist:  None   Referring MD: Ronnald Nian, MD   Chief Complaint: Establish cardiovascular care,, coronary artery calcifications, dyslipidemia, family history of cardiovascular disease  History of Present Illness:    Travis Hoffman is a 53 y.o. male with a hx of dyslipidemia and a family history of coronary artery disease who presents today to establish care with a cardiologist and speak further about his history of coronary artery calcifications and elevated LP(a).  He is a fit 53 year old gentleman with optimal diet lifestyle habits.  He brings with him a note about his past medical history as follows "I am 53, in good health, lifelong exercise enthusiast, especially endurance running and cycling, with family history of CVD.  My father had multiple bypass surgery in his 38s and died of MI at 67 02-07-95).  I have been on a statin since age 10.  In early 2018/02/06 I took it upon myself to get a coronary artery calcium test and LP(a) test, and results of both were concerning.  Age January 2019, close to 52 [sic].  CAC:...255 with calcifications most pronounced in the right coronary artery, also noted in the LAD.  [place] database percentile 96.  LP(a) 296 nmol/L 12/11/2017.  After this I consulted Dr. Hulda Marin, endocrinologist at [facility], and we adjusted my 20 mg Crestor to 10 mg Crestor +10 mg ezetimibe, and consider niacin, which I may still pursue.  The high LP(a) indicates enhanced risk for atherosclerosis, aortic stenosis, venous thrombosis including pulmonary embolism.  So I had like to review how to screen for and address these and other risks.  I researched this and found some Dr. Marquis Buggy for BNP as an indicator of disease severity and aortic stenosis, echo and cardiac MRI to look at the morphology of the aortic  valve and pressure gradient, check for a bicuspid valve, and monitor a host of inflammation markers."  Travis Hoffman and I have had a delightful and highly academic discussion regarding natural history of coronary artery disease, aortic valvular disease, and dyslipidemia.  He is extremely well educated on the current literature and participates fully in the conversation.  The patient denies chest pain, chest pressure, dyspnea at rest or with exertion, palpitations, PND, orthopnea, or leg swelling. Denies syncope or presyncope. Denies dizziness or lightheadedness. Denies snoring and has not been evaluated for sleep apnea.  Past Medical History:  Diagnosis Date   Allergy    RHINITIS   Cardiovascular disease    Dyslipidemia    GIB (gastrointestinal bleeding)     History reviewed. No pertinent surgical history.  Current Medications: No outpatient medications have been marked as taking for the 09/20/19 encounter (Office Visit) with Parke Poisson, MD.     Allergies:   Patient has no known allergies.   Social History   Socioeconomic History   Marital status: Married    Spouse name: Not on file   Number of children: Not on file   Years of education: Not on file   Highest education level: Not on file  Occupational History   Not on file  Tobacco Use   Smoking status: Never Smoker   Smokeless tobacco: Never Used  Substance and Sexual Activity   Alcohol use: Yes    Alcohol/week: 5.0 standard drinks    Types: 5  Cans of beer per week   Drug use: No   Sexual activity: Yes  Other Topics Concern   Not on file  Social History Narrative   Not on file   Social Determinants of Health   Financial Resource Strain:    Difficulty of Paying Living Expenses: Not on file  Food Insecurity:    Worried About Running Out of Food in the Last Year: Not on file   Ran Out of Food in the Last Year: Not on file  Transportation Needs:    Lack of Transportation (Medical): Not  on file   Lack of Transportation (Non-Medical): Not on file  Physical Activity:    Days of Exercise per Week: Not on file   Minutes of Exercise per Session: Not on file  Stress:    Feeling of Stress : Not on file  Social Connections:    Frequency of Communication with Friends and Family: Not on file   Frequency of Social Gatherings with Friends and Family: Not on file   Attends Religious Services: Not on file   Active Member of Clubs or Organizations: Not on file   Attends Banker Meetings: Not on file   Marital Status: Not on file     Family History: The patient's family history includes Arthritis in his mother; Heart disease in his mother and paternal grandfather; Heart disease (age of onset: 37) in his father.  ROS:   Please see the history of present illness.    All other systems reviewed and are negative.  EKGs/Labs/Other Studies Reviewed:    The following studies were reviewed today:  EKG: Sinus bradycardia, minimal voltage criteria for LVH  Recent Labs: 08/04/2019: ALT 26; BUN 21; Creatinine, Ser 0.94; Hemoglobin 13.7; Platelets 267; Potassium 4.6; Sodium 136  Recent Lipid Panel    Component Value Date/Time   CHOL 192 08/04/2019 1428   TRIG 34 08/04/2019 1428   HDL 75 08/04/2019 1428   CHOLHDL 2.6 08/04/2019 1428   CHOLHDL 2.9 07/28/2017 1510   VLDL 10 09/09/2016 0825   LDLCALC 110 (H) 08/04/2019 1428   LDLCALC 95 07/28/2017 1510    Physical Exam:    VS:  BP 120/82 (BP Location: Left Arm, Patient Position: Sitting, Cuff Size: Normal)    Pulse (!) 49    Temp (!) 97.3 F (36.3 C)    Ht 6' 0.5" (1.842 m)    Wt 180 lb (81.6 kg)    BMI 24.08 kg/m     Wt Readings from Last 5 Encounters:  09/20/19 180 lb (81.6 kg)  08/11/19 172 lb (78 kg)  08/04/19 172 lb 9.6 oz (78.3 kg)  04/22/19 170 lb (77.1 kg)  03/11/19 170 lb (77.1 kg)     Constitutional: No acute distress Eyes: sclera non-icteric, normal conjunctiva and lids ENMT: normal  dentition, moist mucous membranes Cardiovascular: regular rhythm, normal rate, no murmurs. S1 and S2 normal. Radial pulses normal bilaterally. No jugular venous distention.  Respiratory: clear to auscultation bilaterally GI : normal bowel sounds, soft and nontender. No distention.   MSK: extremities warm, well perfused. No edema.  NEURO: grossly nonfocal exam, moves all extremities. PSYCH: alert and oriented x 3, normal mood and affect.      ASSESSMENT:    1. Coronary artery calcification   2. LVH (left ventricular hypertrophy)   3. Dyslipidemia   4. Family history of early CAD    PLAN:    Dyslipidemia-the patient and I have discussed his dyslipidemia and that LP(a) is  elevated.  In the setting of a family history of coronary artery disease and coronary artery calcifications, further investigation into his dyslipidemia is warranted.  We have a lipid specialist in our practice, Dr. Rennis GoldenHilty, and I will refer the patient to lipid clinic.  I imagine the patient and Dr. Rennis GoldenHilty will have a highly educated discussion about the literature supporting therapies for risk reduction.  At the current time, continue Crestor 10 mg daily and ezetimibe 10 mg daily.  Coronary artery calcifications-the patient has excellent exercise tolerance, and exercise stress testing is unlikely to be revealing for ischemia at this time.  Patient does not endorse any significant symptoms, however if he endorses any chest discomfort or shortness of breath, CT coronary angiogram for exercise stress test would be my stress modalities of choice.  LVH on ECG-minimal voltage criteria for LVH, given that the patient is interested in screening for cardiovascular disease and there are abnormalities on ECG, and echocardiogram is entirely warranted to ensure there is no LVH.  We spent greater than 45 minutes discussing optimal dietary modifications, exercise recommendations, natural history of aortic valve disease and literature  surrounding the progression of aortic valve disease.  I discussed with the patient that there is no definite medication therapy for the prevention of aortic valve stenosis, however an annual physical exam is reasonable to screen for aortic stenosis.  I explained to the patient that we will be able to detect early aortic valve stenosis with evaluation for murmur.  To address the patient's other concerns, at this time BNP is not strongly indicated given that there are no concerns on physical exam of aortic stenosis or heart failure.  An echocardiogram will be an appropriate first step in screening for a bicuspid aortic valve which I do not have a suspicion of based on exam, therefore cardiac MRI is not indicated today.  If there is evidence of left ventricular hypertrophy, cardiac MRI may be warranted.  No indication to check inflammatory markers today.  I will leave the remainder of the testing to lipid clinic.  TIME SPENT WITH PATIENT: 45 minutes of direct patient care. More than 50% of that time was spent on coordination of care and counseling regarding natural history of coronary artery disease, aortic valve disease, management of asymptomatic CAD, and management of hyperlipidemia.Weston Brass.  Jermayne Sweeney, MD [place]   [facility]   Medication Adjustments/Labs and Tests Ordered: Current medicines are reviewed at length with the patient today.  Concerns regarding medicines are outlined above.  Orders Placed This Encounter  Procedures   ECHOCARDIOGRAM COMPLETE   No orders of the defined types were placed in this encounter.   Patient Instructions  Medication Instructions:  Your physician recommends that you continue on your current medications as directed. Please refer to the Current Medication list given to you today.  *If you need a refill on your cardiac medications before your next appointment, please call your pharmacy*  Lab Work: NONE   Testing/Procedures: Your physician has  requested that you have an echocardiogram. Echocardiography is a painless test that uses sound waves to create images of your heart. It provides your doctor with information about the size and shape of your heart and how well your hearts chambers and valves are working. This procedure takes approximately one hour. There are no restrictions for this procedure. [facility] AT [address]  Follow-Up: At [facility], you and your health needs are our priority.  As part of our continuing  mission to provide you with exceptional heart care, we have created designated Provider Care Teams.  These Care Teams include your primary Cardiologist (physician) and Advanced Practice Providers (APPs -  Physician Assistants and Nurse Practitioners) who all work together to provide you with the care you need, when you need it.  Your next appointment:   12 month(s) You will receive a reminder letter in the mail two months in advance. If you don't receive a letter, please call our office to schedule the follow-up appointment.   The format for your next appointment:   In Person  Provider:   You may see DR Margaretann Loveless  or one of the following Advanced Practice Providers on your designated Care Team:    Rosaria Ferries, PA-C  Jory Sims, DNP, ANP  Cadence Kathlen Mody, NP  Your physician recommends that you schedule a follow-up appointment in: DR [place]

## 2019-09-23 ENCOUNTER — Other Ambulatory Visit (INDEPENDENT_AMBULATORY_CARE_PROVIDER_SITE_OTHER): Payer: BC Managed Care – PPO

## 2019-09-23 DIAGNOSIS — E785 Hyperlipidemia, unspecified: Secondary | ICD-10-CM

## 2019-09-23 DIAGNOSIS — I517 Cardiomegaly: Secondary | ICD-10-CM

## 2019-09-23 DIAGNOSIS — Z8249 Family history of ischemic heart disease and other diseases of the circulatory system: Secondary | ICD-10-CM

## 2019-09-23 NOTE — Progress Notes (Signed)
ek 

## 2019-09-27 ENCOUNTER — Encounter: Payer: Self-pay | Admitting: Family Medicine

## 2019-09-29 ENCOUNTER — Other Ambulatory Visit: Payer: Self-pay

## 2019-09-29 ENCOUNTER — Ambulatory Visit (HOSPITAL_COMMUNITY): Payer: BC Managed Care – PPO | Attending: Cardiology

## 2019-09-29 DIAGNOSIS — I517 Cardiomegaly: Secondary | ICD-10-CM | POA: Insufficient documentation

## 2019-10-14 ENCOUNTER — Telehealth (INDEPENDENT_AMBULATORY_CARE_PROVIDER_SITE_OTHER): Payer: BC Managed Care – PPO | Admitting: Internal Medicine

## 2019-10-14 ENCOUNTER — Encounter: Payer: Self-pay | Admitting: Internal Medicine

## 2019-10-14 VITALS — BP 120/82 | HR 49 | Ht 72.5 in | Wt 180.0 lb

## 2019-10-14 DIAGNOSIS — Z8249 Family history of ischemic heart disease and other diseases of the circulatory system: Secondary | ICD-10-CM

## 2019-10-14 DIAGNOSIS — I2584 Coronary atherosclerosis due to calcified coronary lesion: Secondary | ICD-10-CM

## 2019-10-14 DIAGNOSIS — E785 Hyperlipidemia, unspecified: Secondary | ICD-10-CM

## 2019-10-14 DIAGNOSIS — I2510 Atherosclerotic heart disease of native coronary artery without angina pectoris: Secondary | ICD-10-CM

## 2019-10-14 NOTE — Patient Instructions (Signed)
Medication Instructions:  Your physician recommends that you continue on your current medications as directed. Please refer to the Current Medication list given to you today.  *If you need a refill on your cardiac medications before your next appointment, please call your pharmacy*  Follow-Up: At [facility], you and your health needs are our priority.  As part of our continuing mission to provide you with exceptional heart care, we have created designated Provider Care Teams.  These Care Teams include your primary Cardiologist (physician) and Advanced Practice Providers (APPs -  Physician Assistants and Nurse Practitioners) who all work together to provide you with the care you need, when you need it.  Your next appointment:   AS NEEDED with Dr. Hilty in the lipid clinic 

## 2019-10-14 NOTE — Progress Notes (Signed)
Virtual Visit via Video Note   This visit type was conducted due to national recommendations for restrictions regarding the COVID-19 Pandemic (e.g. social distancing) in an effort to limit this patient's exposure and mitigate transmission in our community.  Due to his co-morbid illnesses, this patient is at least at moderate risk for complications without adequate follow up.  This format is felt to be most appropriate for this patient at this time.  All issues noted in this document were discussed and addressed.  A limited physical exam was performed with this format.  Please refer to the patient's chart for his consent to telehealth for [facility].   Evaluation Performed:  Doxy.me video visit  Date:  10/14/2019   ID:  Travis FusiDavid Hoffman, DOB 07/23/1966, MRN [id number]  Patient Location:  [address] [place] [address]  Provider location:   [address] [place], [place] [address]  PCP:  Ronnald NianLalonde, John C, MD  Cardiologist:  No primary care provider on file. Electrophysiologist:  None   Chief Complaint:  Evaluate dyslipidemia  History of Present Illness:    Travis Hoffman is a 53 y.o. male who presents via audio/video conferencing for a telehealth visit today.  Travis Hoffman was kindly referred by Dr. Jacques NavyAcharya for evaluation and management of dyslipidemia.  He 53-year-old male with a history of dyslipidemia and coronary artery calcification (CAC 225 with calcification in the right coronary and LAD, 96 percentile) and elevated LPA of 296 nmol/L in the past as well as a family history of premature coronary disease.  He reported his father had multiple vessel bypass surgery in his 7150s and died of an MI at age 52.  Based on some of this testing he was identified as having dyslipidemia in his 30s and started on statin therapy.  Recently he was [facility], who further recommended some changes to his lipid therapy.  At the time he was having side  effects on Crestor but had successfully driven his LDL down to 80.  The Crestor was then decreased from 20 to 10 mg and ezetimibe was added and there was discussion about adding niacin.  He also reported to Dr. Jacques NavyAcharya that he had multiple concerns about how he could evaluate and possibly prevent some of the effects of elevated LP(a), which incidentally was measured at 296 nmol/L in February 2019.  Today we had a similar discussion about his concerns regarding elevated LP(a) and his targets of lipid therapy.  Based on his high LP(a) and strong family history of premature onset heart disease, current guidelines would suggest to target an LDL-C less than 70.  He proceeded to have a lipid particle test done by his primary care provider in October 2020.  This demonstrated an LDL-PA 1001 and 24, LDL-C of 119, HDL-C of 82 and triglycerides of 44.  Total cholesterol was 209 and small LDL particle number was low at 200.  Overall this appears to be a better lipid profile may represent recent changes he made to his diet.  He said he tried several different diets including a vegan diet for short period of time as well as more recently a low-carb and high fat diet.  He is trying to introduce a number of antioxidants into his diet as well.  He also exercises regularly doing distance cycling as well as running.  We discussed at length about oxidized lipids and he was questioning whether we could do testing for Ox-LDL and LP-PLA2 -  currently this is not standard testing outside of a research trial. Additionally, he is interested in CIMT testing - I informed him that we do not perform this, as it requires special equipment and expertise and that we prefer calcium scoring and other risk stratifying tests.  The patient does not have symptoms concerning for COVID-19 infection (fever, chills, cough, or new SHORTNESS OF BREATH).    Prior CV studies:   The following studies were reviewed today:  Lipids  PMHx:  Past Medical  History:  Diagnosis Date   Allergy    RHINITIS   Cardiovascular disease    Dyslipidemia    GIB (gastrointestinal bleeding)     No past surgical history on file.  FAMHx:  Family History  Problem Relation Age of Onset   Arthritis Mother    Heart disease Mother        Stents   Heart disease Father 10       MI   Heart disease Paternal Grandfather     SOCHx:   reports that he has never smoked. He has never used smokeless tobacco. He reports current alcohol use of about 5.0 standard drinks of alcohol per week. He reports that he does not use drugs.  ALLERGIES:  No Known Allergies  MEDS:  Current Meds  Medication Sig   albuterol (PROVENTIL HFA;VENTOLIN HFA) 108 (90 Base) MCG/ACT inhaler USE 2 INHALATIONS EVERY 6 HOURS AS NEEDED FOR WHEEZING   B COMPLEX-C PO Take 1 capsule by mouth.   Coenzyme Q10 (CO Q10) 100 MG CAPS Take 1 capsule by mouth.   ezetimibe (ZETIA) 10 MG tablet Take 1 tablet (10 mg total) by mouth daily.   Magnesium 400 MG TABS Take 1 tablet by mouth.   rosuvastatin (CRESTOR) 10 MG tablet Take 1 tablet (10 mg total) by mouth daily.     ROS: Pertinent items noted in HPI and remainder of comprehensive ROS otherwise negative.  Labs/Other Tests and Data Reviewed:    Recent Labs: 08/04/2019: ALT 26; BUN 21; Creatinine, Ser 0.94; Hemoglobin 13.7; Platelets 267; Potassium 4.6; Sodium 136   Recent Lipid Panel Lab Results  Component Value Date/Time   CHOL 192 08/04/2019 02:28 PM   TRIG 34 08/04/2019 02:28 PM   HDL 75 08/04/2019 02:28 PM   CHOLHDL 2.6 08/04/2019 02:28 PM   CHOLHDL 2.9 07/28/2017 03:10 PM   LDLCALC 110 (H) 08/04/2019 02:28 PM   LDLCALC 95 07/28/2017 03:10 PM    Wt Readings from Last 3 Encounters:  10/14/19 180 lb (81.6 kg)  09/20/19 180 lb (81.6 kg)  08/11/19 172 lb (78 kg)     Exam:    Vital Signs:  BP 120/82    Pulse (!) 49    Ht 6' 0.5" (1.842 m)    Wt 180 lb (81.6 kg)    BMI 24.08 kg/m    General appearance: alert  and no distress Lungs: No visual respiratory difficulty Extremities: extremities normal, atraumatic, no cyanosis or edema Skin: Skin color, texture, turgor normal. No rashes or lesions Neurologic: Mental status: Alert, oriented, thought content appropriate Psych: Pleasant  ASSESSMENT & PLAN:    1. Mixed dyslipidemia 2. Elevated CAC score -255 3. 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 History of premature coronary disease  Travis Hoffman has a mixed dyslipidemia with strong family history of early onset heart disease as well as evidence of premature heart disease likely related to high LP(a).  We discussed current therapies available for LP(a) as well as research opportunities which are around the  corner.  This includes a short interfering RNA to LP(a) which showed a promising 80% reduction in levels without a clear outcome study yet.  Currently, the PCSK9 inhibitor showed the best data at [facility] reduction about 20 to 30% however again outcome data is not clear with this.  Since his LDL-C remains above 70 and he is currently on max tolerated statin therapy as well as ezetimibe, he is a good candidate for PCSK9 inhibitor.  Given his elevated calcium score and intermediate to high risk of cardiovascular disease, I think he would qualify for this however he is not interested in additional medication at this time, rather he would like to pursue dietary modifications and trying to reduce oxidized LDL.  He is interested in CIMT monitoring and I will defer to his primary cardiologist as she may be aware of providers in the area that performed this testing - I am not.  Thanks again for the kind referral.  I am happy to see him back as needed if he chooses to pursue additional therapies and will keep him in a database of patients with elevated LP(a) for when treatments become available that are specific.  COVID-19 Education: The signs and symptoms of COVID-19 were discussed with the patient and how to seek care for  testing (follow up with PCP or arrange E-visit).  The importance of social distancing was discussed today.  Patient Risk:   After full review of this patients clinical status, I feel that they are at least moderate risk at this time.  Time:   Today, I have spent 40 minutes with the patient with telehealth technology discussing dyslipidemia, early onset heart disease, elevated LP(a), mechanisms of oxidation of lipids, role of PCSK9 inhibitors, theories about dietary management of dyslipidemia, oxidative stress, etc.   Medication Adjustments/Labs and Tests Ordered: Current medicines are reviewed at length with the patient today.  Concerns regarding medicines are outlined above.   Tests Ordered: No orders of the defined types were placed in this encounter.   Medication Changes: No orders of the defined types were placed in this encounter.   Disposition:  prn  Chrystie Nose, MD, Milagros Loll  [place]   [facility]  Medical Director of the Advanced Lipid Disorders &  Cardiovascular Risk Reduction Clinic Diplomate of the American Board of Clinical Lipidology Attending Cardiologist  Direct Dial: [phone]   Fax: [phone]  Website:  [email or web address]  Chrystie Nose, MD  10/14/2019 2:35 PM

## 2020-05-04 DIAGNOSIS — Z03818 Encounter for observation for suspected exposure to other biological agents ruled out: Secondary | ICD-10-CM | POA: Diagnosis not present

## 2020-05-04 DIAGNOSIS — Z20822 Contact with and (suspected) exposure to covid-19: Secondary | ICD-10-CM | POA: Diagnosis not present

## 2020-05-19 DIAGNOSIS — Z20822 Contact with and (suspected) exposure to covid-19: Secondary | ICD-10-CM | POA: Diagnosis not present

## 2020-05-19 DIAGNOSIS — Z03818 Encounter for observation for suspected exposure to other biological agents ruled out: Secondary | ICD-10-CM | POA: Diagnosis not present

## 2020-06-17 DIAGNOSIS — Z20822 Contact with and (suspected) exposure to covid-19: Secondary | ICD-10-CM | POA: Diagnosis not present

## 2020-07-20 ENCOUNTER — Telehealth: Payer: Self-pay | Admitting: Internal Medicine

## 2020-07-20 NOTE — Telephone Encounter (Signed)
lvm for patient to return call to get follow up scheduled with Acharya from recall list 

## 2020-07-27 ENCOUNTER — Encounter: Payer: Self-pay | Admitting: Family Medicine

## 2020-09-26 ENCOUNTER — Telehealth: Payer: Self-pay | Admitting: Internal Medicine

## 2020-09-26 NOTE — Telephone Encounter (Signed)
Called to schedule 12 mos follow up with Dr. Langston Masker states he does not need this follow up---will call if he needs to be seen---recall deleted

## 2020-10-03 DIAGNOSIS — Z125 Encounter for screening for malignant neoplasm of prostate: Secondary | ICD-10-CM | POA: Diagnosis not present

## 2020-10-03 DIAGNOSIS — Z6825 Body mass index (BMI) 25.0-25.9, adult: Secondary | ICD-10-CM | POA: Diagnosis not present

## 2020-10-03 DIAGNOSIS — E782 Mixed hyperlipidemia: Secondary | ICD-10-CM | POA: Diagnosis not present

## 2020-10-03 DIAGNOSIS — Z0000 Encounter for general adult medical examination without abnormal findings: Secondary | ICD-10-CM | POA: Diagnosis not present

## 2020-10-11 ENCOUNTER — Other Ambulatory Visit: Payer: Self-pay | Admitting: Family Medicine

## 2020-10-11 DIAGNOSIS — Z8249 Family history of ischemic heart disease and other diseases of the circulatory system: Secondary | ICD-10-CM

## 2020-10-17 DIAGNOSIS — E782 Mixed hyperlipidemia: Secondary | ICD-10-CM | POA: Diagnosis not present

## 2020-10-17 DIAGNOSIS — Z125 Encounter for screening for malignant neoplasm of prostate: Secondary | ICD-10-CM | POA: Diagnosis not present

## 2021-02-09 DIAGNOSIS — H2513 Age-related nuclear cataract, bilateral: Secondary | ICD-10-CM | POA: Diagnosis not present

## 2021-03-23 ENCOUNTER — Ambulatory Visit (INDEPENDENT_AMBULATORY_CARE_PROVIDER_SITE_OTHER): Payer: BC Managed Care – PPO | Admitting: Family Medicine

## 2021-03-23 ENCOUNTER — Other Ambulatory Visit: Payer: Self-pay

## 2021-03-23 ENCOUNTER — Ambulatory Visit
Admission: RE | Admit: 2021-03-23 | Discharge: 2021-03-23 | Disposition: A | Discharge status: Home / Self Care | Payer: BC Managed Care – PPO | Source: Ambulatory Visit | Attending: Family Medicine | Admitting: Family Medicine

## 2021-03-23 VITALS — BP 108/72 | Ht 72.5 in | Wt 180.0 lb

## 2021-03-23 DIAGNOSIS — M545 Low back pain, unspecified: Secondary | ICD-10-CM | POA: Diagnosis not present

## 2021-03-23 DIAGNOSIS — G8929 Other chronic pain: Secondary | ICD-10-CM

## 2021-03-23 NOTE — Patient Instructions (Signed)
I have put I n for your x rays. Lets see you back next week.  You can try OTC medications like Voltaren gel or Aspercreme.

## 2021-03-23 NOTE — Progress Notes (Signed)
  Travis Hoffman - 55 y.o. male MRN [id number]  Date of birth: 1966/09/22  SUBJECTIVE:    CC: Low Back Pain  Travis Hoffman presents with pain in his low back since his teens. He recently aggravated the pain while sailing. The pain gets worse with forward flexion of torso. He has been doing hyper-extension exercises which seem to help relieve the pain. He is worried that the pain is related to SI joint pain. He does not take anything for the pain. He has no radicular symptoms or weakness in his legs.  He is very active, exercising daily.  He does a lot of yoga.   Objective:  VS: BP:108/72  HR: bpm  TEMP: ( )  RESP:   HT:6' 0.5" (184.2 cm)   WT:180 lb (81.6 kg)  BMI:24.06 PHYSICAL EXAM:  Well appearing male in NAD. A&Ox3   BACK EXAMINATION: Normal Gait.  Normal Spinal curvature, without excessive lumbar lordosis, thoracic kyphosis, or scoliosis.  No pain with forward flexion or extension.  No pain with Rotation or Side-bending.  Minimally tender just lateral to the lumbar spine along the paraspinals and over the lateral process of L5.  There is no midline tenderness, no deformity or step-offs. No tenderness over the SI Joints bilaterally. Gluteus musculature and piriformis without tender points.   Normal range of motion in forward flexion, hyperextension, lateral rotation and all of these motions are painless. MSK: Normal muscle bulk and tone.  ASSESSMENT & PLAN:  Low Back Pain: Chronic low back pain, exacerbated by certain activities such as sailing.  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  There may be some small component of sacroiliac joint pain but I did not see that on exam today.  We will get x-rays to rule out any occult issues.  He is already doing all of the things I would tell him to do which is be active, stretching, low impact activities.   He has been advised to stry voltaren gel or aspercreme for pain. If needed he can use over-the-counter oral  antiinflammatories. We will call him with the results of the Xray. He can follow up on an as needed basis.   Aurelio Jew, MS4

## 2021-03-23 NOTE — Progress Notes (Signed)
[facility] Attending Note: I have seen and examined this patient with the medical student. I have  reviewed the history, physical examination, assessment and plan as documented in the medical student's note.  I agree with the medical student's note and findings, assessment and treatment plan as documented with the following additions or changes: I suspect facet joint arthritis.  X-rays to rule out anything unusual.  He is already doing most of the things that I would recommend for maintaining low back flexibility.  No red flags.  We will call him with x-ray results.  He will follow-up as needed with any new or worsening symptoms.

## 2021-03-26 ENCOUNTER — Encounter: Payer: Self-pay | Admitting: Family Medicine

## 2021-03-28 DIAGNOSIS — H43393 Other vitreous opacities, bilateral: Secondary | ICD-10-CM | POA: Diagnosis not present

## 2021-03-28 DIAGNOSIS — H2511 Age-related nuclear cataract, right eye: Secondary | ICD-10-CM | POA: Diagnosis not present

## 2021-03-28 DIAGNOSIS — H25041 Posterior subcapsular polar age-related cataract, right eye: Secondary | ICD-10-CM | POA: Diagnosis not present

## 2021-03-28 DIAGNOSIS — H2513 Age-related nuclear cataract, bilateral: Secondary | ICD-10-CM | POA: Diagnosis not present

## 2021-04-24 DIAGNOSIS — H25811 Combined forms of age-related cataract, right eye: Secondary | ICD-10-CM | POA: Diagnosis not present

## 2021-04-24 DIAGNOSIS — H2512 Age-related nuclear cataract, left eye: Secondary | ICD-10-CM | POA: Diagnosis not present

## 2021-04-24 DIAGNOSIS — H2511 Age-related nuclear cataract, right eye: Secondary | ICD-10-CM | POA: Diagnosis not present

## 2021-06-10 ENCOUNTER — Other Ambulatory Visit: Payer: Self-pay | Admitting: Family Medicine

## 2021-06-10 DIAGNOSIS — Z8249 Family history of ischemic heart disease and other diseases of the circulatory system: Secondary | ICD-10-CM

## 2021-07-12 ENCOUNTER — Encounter (HOSPITAL_BASED_OUTPATIENT_CLINIC_OR_DEPARTMENT_OTHER): Payer: Self-pay | Admitting: Family Medicine

## 2021-07-12 ENCOUNTER — Ambulatory Visit (INDEPENDENT_AMBULATORY_CARE_PROVIDER_SITE_OTHER): Payer: BC Managed Care – PPO | Admitting: Family Medicine

## 2021-07-12 ENCOUNTER — Other Ambulatory Visit: Payer: Self-pay

## 2021-07-12 VITALS — BP 104/64 | HR 68 | Ht 72.5 in | Wt 179.6 lb

## 2021-07-12 DIAGNOSIS — E785 Hyperlipidemia, unspecified: Secondary | ICD-10-CM

## 2021-07-12 DIAGNOSIS — Z8249 Family history of ischemic heart disease and other diseases of the circulatory system: Secondary | ICD-10-CM | POA: Diagnosis not present

## 2021-07-12 NOTE — Patient Instructions (Signed)
  Medication Instructions:  Your physician recommends that you continue on your current medications as directed. Please refer to the Current Medication list given to you today. --If you need a refill on any your medications before your next appointment, please call your pharmacy first. If no refills are authorized on file call the office.-- Lab Work: Your physician has recommended that you have lab work today: CBC, CMP, lipid Profile, TSH, NMR If you have labs (blood work) drawn today and your tests are completely normal, you will receive your results via MyChart message OR a phone call from our staff.  Please ensure you check your voicemail in the event that you authorized detailed messages to be left on a delegated number. If you have any lab test that is abnormal or we need to change your treatment, we will call you to review the results.  Follow-Up: Your next appointment:   Your physician recommends that you schedule a follow-up appointment in: 1-2 WEEKS for CPE with Dr. de Peru  You will receive a text message or e-mail with a link to a survey about your care and experience with [place] today! We would greatly appreciate your feedback!   Thanks for letting us be apart of your health journey!!  Primary Care and Sports Medicine   Dr. Ceasar Mons Peru   We encourage you to activate your patient portal called "MyChart".  Sign up information is provided on this After Visit Summary.  MyChart is used to connect with patients for Virtual Visits (Telemedicine).  Patients are able to view lab/test results, encounter notes, upcoming appointments, etc.  Non-urgent messages can be sent to your provider as well. To learn more about what you can do with MyChart, please visit --  [email or web address].

## 2021-07-12 NOTE — Assessment & Plan Note (Signed)
Due for recheck of lipid panel, will be ordered in anticipation of CPE Continue with statin and ezetimibe As discussed with [facility], given that LDL remains above goal, PCSK9 inhibitor would be a consideration to help with reducing risk.  We will assess current numbers with updated lipid panel

## 2021-07-12 NOTE — Assessment & Plan Note (Signed)
Patient reports that his father had notable heart disease and that he had a heart attack in his 38s and this led to him further researching risk factors and ways to mitigate these Patient has previously been evaluated in the lipid clinic at [facility] as well as through [facility] health. At present, will continue with statin, ezetimibe Could consider referral back to lipid clinic for further discussion of management options

## 2021-07-12 NOTE — Progress Notes (Signed)
New Patient Office Visit  Subjective:  Patient ID: Travis Hoffman, male    DOB: 27-Aug-1966  Age: 55 y.o. MRN: [id number]  CC:  Chief Complaint  Patient presents with   Establish Care    Prior PCP [facility] - Notes in Care Everywhere   Hyperlipidemia    Patient want to ensure he is being managed with is particular type of hyperlipidemia   Annual Exam    Patient is requesting a CPE.   Arthritis    Patinet has Lumbar Facet Arthritis that he is managing with at home PT but is maybe interested in other treatments    HPI Travis Hoffman is a 55 year old male presenting to establish clinic.  He has current concerns as outlined above.  Reports past medical history of dyslipidemia, facet joint disease.  He has concerns related to management of lipid disorder due to family history of heart disease.  Dyslipidemia: Has been identified to have elevated LDL as well as elevated CAC score with elevated LP(a).  Patient has worked with [facility] clinic as well as seeing lipid clinic through [facility].  He is currently managed on statin as well as ezetimibe.  Reviewing the chart shows that in December 2020, patient did meet with [facility] and discussed alternative evaluations and treatment options.  Possible use of PCSK9 inhibitor was discussed, however at that time patient elected to proceed with lifestyle modifications and declined additional pharmacotherapy.  He has some questions surrounding management of cholesterol issues today.  Reports a history of facet joint disease for which he was seeing a sports medicine provider previously.  Has some question surrounding treatment related to facet joint degenerative disease, including conservative, interventional, procedural.  Patient engages in regular physical activity.  His father's heart history as well as his own evaluation and to his health led to him beginning his crosstraining.  Initially did not do a lot of high endurance cardiovascular  activity.  More recently, has transitioned to incorporating strength training into his routine.  Past Medical History:  Diagnosis Date   Allergy    RHINITIS   Cardiovascular disease    Dyslipidemia    GIB (gastrointestinal bleeding)     No past surgical history on file.  Family History  Problem Relation Age of Onset   Arthritis Mother    Heart disease Mother        Stents   Heart disease Father 44       MI   Heart disease Paternal Grandfather     Social History   Socioeconomic History   Marital status: Married    Spouse name: Not on file   Number of children: Not on file   Years of education: Not on file   Highest education level: Not on file  Occupational History   Not on file  Tobacco Use   Smoking status: Never   Smokeless tobacco: Never  Vaping Use   Vaping Use: Never used  Substance and Sexual Activity   Alcohol use: Yes    Alcohol/week: 5.0 standard drinks    Types: 5 Cans of beer per week   Drug use: No   Sexual activity: Yes  Other Topics Concern   Not on file  Social History Narrative   Not on file   Social Determinants of Health   Financial Resource Strain: Not on file  Food Insecurity: Not on file  Transportation Needs: Not on file  Physical Activity: Not on file  Stress: Not on file  Social Connections: Not on file  Intimate Partner Violence: Not on file    Objective:   Today's Vitals: BP 104/64   Pulse 68   Ht 6' 0.5" (1.842 m)   Wt 179 lb 9.6 oz (81.5 kg)   SpO2 98%   BMI 24.02 kg/m   Physical Exam  55 year old male in no acute distress Cardiovascular exam with regular rate and rhythm, no murmurs appreciated Lungs clear to auscultation bilaterally  Assessment & Plan:   Problem List Items Addressed This Visit       Other   Family history of heart disease in male family member before age 69 - Primary (Chronic)    Patient reports that his father had notable heart disease and that he had a heart attack in his 60s and this  led to him further researching risk factors and ways to mitigate these Patient has previously been evaluated in the lipid clinic at [facility] as well as through [facility] health. At present, will continue with statin, ezetimibe Could consider referral back to lipid clinic for further discussion of management options      Relevant Orders   CBC with Differential/Platelet   Comprehensive metabolic panel   Lipid panel   TSH Rfx on Abnormal to Free T4   NMR LipoProf + Graph   Hyperlipidemia LDL goal <70    Due for recheck of lipid panel, will be ordered in anticipation of CPE Continue with statin and ezetimibe As discussed with [facility] lipid clinic, given that LDL remains above goal, PCSK9 inhibitor would be a consideration to help with reducing risk.  We will assess current numbers with updated lipid panel      Relevant Orders   Lipid panel   NMR LipoProf + Graph   Discussed additional issues including right forefoot pain under metatarsal heads which seems most consistent with possible Morton's neuroma.  Discussed likely etiology and treatment options which can be considered.  Outpatient Encounter Medications as of 07/12/2021  Medication Sig   albuterol (PROVENTIL HFA;VENTOLIN HFA) 108 (90 Base) MCG/ACT inhaler USE 2 INHALATIONS EVERY 6 HOURS AS NEEDED FOR WHEEZING   B COMPLEX-C PO Take 1 capsule by mouth.   Coenzyme Q10 (CO Q10) 100 MG CAPS Take 1 capsule by mouth.   ezetimibe (ZETIA) 10 MG tablet TAKE 1 TABLET DAILY   Magnesium 400 MG TABS Take 1 tablet by mouth.   rosuvastatin (CRESTOR) 10 MG tablet Take 1 tablet (10 mg total) by mouth daily.   vitamin k 100 MCG tablet Take 1 tablet by mouth daily.   No facility-administered encounter medications on file as of 07/12/2021.   Spent 60 minutes on this patient encounter, including preparation, chart review, face-to-face counseling with patient and coordination of care, and documentation of encounter  Follow-up: No follow-ups on file.  Plan  for follow-up in about 1 to 2 weeks to complete CPE.  We will review labs at that time.  Alfie Alderfer J De Peru, MD

## 2021-07-13 LAB — CBC WITH DIFFERENTIAL/PLATELET
Basophils Absolute: 0 10*3/uL (ref 0.0–0.2)
Basos: 1 %
EOS (ABSOLUTE): 0.2 10*3/uL (ref 0.0–0.4)
Eos: 4 %
Hematocrit: 42 % (ref 37.5–51.0)
Hemoglobin: 13.7 g/dL (ref 13.0–17.7)
Immature Grans (Abs): 0 10*3/uL (ref 0.0–0.1)
Immature Granulocytes: 0 %
Lymphocytes Absolute: 1.8 10*3/uL (ref 0.7–3.1)
Lymphs: 36 %
MCH: 26.4 pg — ABNORMAL LOW (ref 26.6–33.0)
MCHC: 32.6 g/dL (ref 31.5–35.7)
MCV: 81 fL (ref 79–97)
Monocytes Absolute: 0.5 10*3/uL (ref 0.1–0.9)
Monocytes: 9 %
Neutrophils Absolute: 2.6 10*3/uL (ref 1.4–7.0)
Neutrophils: 50 %
Platelets: 250 10*3/uL (ref 150–450)
RBC: 5.19 x10E6/uL (ref 4.14–5.80)
RDW: 13 % (ref 11.6–15.4)
WBC: 5.2 10*3/uL (ref 3.4–10.8)

## 2021-07-13 LAB — COMPREHENSIVE METABOLIC PANEL
ALT: 31 IU/L (ref 0–44)
AST: 32 IU/L (ref 0–40)
Albumin/Globulin Ratio: 2.4 — ABNORMAL HIGH (ref 1.2–2.2)
Albumin: 4.7 g/dL (ref 3.8–4.9)
Alkaline Phosphatase: 55 IU/L (ref 44–121)
BUN/Creatinine Ratio: 19 (ref 9–20)
BUN: 19 mg/dL (ref 6–24)
Bilirubin Total: 0.7 mg/dL (ref 0.0–1.2)
CO2: 21 mmol/L (ref 20–29)
Calcium: 9.2 mg/dL (ref 8.7–10.2)
Chloride: 102 mmol/L (ref 96–106)
Creatinine, Ser: 0.98 mg/dL (ref 0.76–1.27)
Globulin, Total: 2 g/dL (ref 1.5–4.5)
Glucose: 96 mg/dL (ref 70–99)
Potassium: 4.7 mmol/L (ref 3.5–5.2)
Sodium: 138 mmol/L (ref 134–144)
Total Protein: 6.7 g/dL (ref 6.0–8.5)
eGFR: 92 mL/min/{1.73_m2} (ref >59)

## 2021-07-13 LAB — NMR LIPOPROF + GRAPH
Cholesterol, Total: 164 mg/dL (ref 100–199)
HDL Particle Number: 42 umol/L (ref >=30.5)
HDL-C: 71 mg/dL (ref >39)
LDL Particle Number: 941 nmol/L (ref <1000)
LDL Size: 21.1 nm (ref >20.5)
LDL-C (NIH Calc): 82 mg/dL (ref 0–99)
LP-IR Score: <25 (ref <=45)
Small LDL Particle Number: 375 nmol/L (ref <=527)
Triglycerides: 52 mg/dL (ref 0–149)

## 2021-07-13 LAB — LIPID PANEL
Chol/HDL Ratio: 2.5 ratio (ref 0.0–5.0)
Cholesterol, Total: 162 mg/dL (ref 100–199)
HDL: 65 mg/dL (ref >39)
LDL Chol Calc (NIH): 87 mg/dL (ref 0–99)
Triglycerides: 47 mg/dL (ref 0–149)
VLDL Cholesterol Cal: 10 mg/dL (ref 5–40)

## 2021-07-13 LAB — TSH RFX ON ABNORMAL TO FREE T4: TSH: 1.26 u[IU]/mL (ref 0.450–4.500)

## 2021-07-31 ENCOUNTER — Telehealth (HOSPITAL_BASED_OUTPATIENT_CLINIC_OR_DEPARTMENT_OTHER): Payer: Self-pay

## 2021-07-31 NOTE — Telephone Encounter (Signed)
-----   Message from Hosie Poisson Peru, MD sent at 07/30/2021 11:16 PM EDT ----- Travis Hoffman blood cell and red blood cell counts are normal with normal hemoglobin.  Electrolytes, kidney function and liver function are all normal.  Thyroid function is normal.  Lipoprotein analysis with improvement in numbers compared to prior reading about 2 years ago.

## 2021-07-31 NOTE — Telephone Encounter (Signed)
Per DPR, left detailed message of results and recommendations on designated number Instructed patient to view results via MyChart or call the office with any questions or concerns.   

## 2021-08-09 ENCOUNTER — Ambulatory Visit (INDEPENDENT_AMBULATORY_CARE_PROVIDER_SITE_OTHER): Payer: BC Managed Care – PPO | Admitting: Family Medicine

## 2021-08-09 ENCOUNTER — Other Ambulatory Visit: Payer: Self-pay

## 2021-08-09 ENCOUNTER — Encounter (HOSPITAL_BASED_OUTPATIENT_CLINIC_OR_DEPARTMENT_OTHER): Payer: Self-pay | Admitting: Family Medicine

## 2021-08-09 VITALS — BP 114/80 | HR 61 | Ht 72.5 in | Wt 182.0 lb

## 2021-08-09 DIAGNOSIS — Z23 Encounter for immunization: Secondary | ICD-10-CM

## 2021-08-09 DIAGNOSIS — Z0000 Encounter for general adult medical examination without abnormal findings: Secondary | ICD-10-CM | POA: Diagnosis not present

## 2021-08-09 DIAGNOSIS — Z1211 Encounter for screening for malignant neoplasm of colon: Secondary | ICD-10-CM

## 2021-08-09 DIAGNOSIS — Z8249 Family history of ischemic heart disease and other diseases of the circulatory system: Secondary | ICD-10-CM

## 2021-08-09 MED ORDER — ZOSTER VAC RECOMB ADJUVANTED 50 MCG/0.5ML IM SUSR: 0.5000 mL | Freq: Once | INTRAMUSCULAR | 0 refills | Status: AC

## 2021-08-09 MED ORDER — EZETIMIBE 10 MG PO TABS: 10.0000 mg | ORAL_TABLET | Freq: Every day | ORAL | 3 refills | Status: DC

## 2021-08-09 MED ORDER — ROSUVASTATIN CALCIUM 10 MG PO TABS: 10.0000 mg | ORAL_TABLET | Freq: Every day | ORAL | 3 refills | Status: DC

## 2021-08-09 NOTE — Patient Instructions (Signed)
°  Medication Instructions:  °Your physician recommends that you continue on your current medications as directed. Please refer to the Current Medication list given to you today. °--If you need a refill on any your medications before your next appointment, please call your pharmacy first. If no refills are authorized on file call the office.-- °Follow-Up: °Your next appointment:   °Your physician recommends that you schedule a follow-up appointment in: 1 YEAR with Dr. de Cuba ° °You will receive a text message or e-mail with a link to a survey about your care and experience with us today! We would greatly appreciate your feedback!  ° °Thanks for letting us be apart of your health journey!!  °Primary Care and Sports Medicine  ° °Dr. Raymond de Cuba  ° °We encourage you to activate your patient portal called "MyChart".  Sign up information is provided on this After Visit Summary.  MyChart is used to connect with patients for Virtual Visits (Telemedicine).  Patients are able to view lab/test results, encounter notes, upcoming appointments, etc.  Non-urgent messages can be sent to your provider as well. To learn more about what you can do with MyChart, please visit --  [email or web address]   ° °

## 2021-08-09 NOTE — Assessment & Plan Note (Signed)
The patient was counseled, risk factors were discussed, anticipatory guidance given. Up-to-date on dental visits and vision screening, encouraged to continue this Patient due for flu vaccine, declines today Recommend shingles vaccination, patient is interested, prescription provided for patient to receive at local pharmacy Up-to-date on tetanus booster Had Cologuard last completed in November 2019, will now be due for this given that it has been 3 years, order placed Recommend continuing with regular activity, appropriate diet Requesting refill of Zetia and Crestor today, provided

## 2021-08-09 NOTE — Progress Notes (Signed)
Subjective:    CC: Annual Physical Exam  HPI:  Remberto Lienhard is a 55 y.o. presenting for annual physical  I reviewed the past medical history, family history, social history, surgical history, and allergies today and no changes were needed.  Please see the problem list section below in epic for further details.  Past Medical History: Past Medical History:  Diagnosis Date   Allergy    RHINITIS   Cardiovascular disease    Dyslipidemia    GIB (gastrointestinal bleeding)    Past Surgical History: History reviewed. No pertinent surgical history. Social History: Social History   Socioeconomic History   Marital status: Married    Spouse name: Not on file   Number of children: Not on file   Years of education: Not on file   Highest education level: Not on file  Occupational History   Not on file  Tobacco Use   Smoking status: Never   Smokeless tobacco: Never  Vaping Use   Vaping Use: Never used  Substance and Sexual Activity   Alcohol use: Yes    Alcohol/week: 5.0 standard drinks    Types: 5 Cans of beer per week   Drug use: No   Sexual activity: Yes  Other Topics Concern   Not on file  Social History Narrative   Not on file   Social Determinants of Health   Financial Resource Strain: Not on file  Food Insecurity: Not on file  Transportation Needs: Not on file  Physical Activity: Not on file  Stress: Not on file  Social Connections: Not on file   Family History: Family History  Problem Relation Age of Onset   Arthritis Mother    Heart disease Mother        Stents   Heart disease Father 26       MI   Heart disease Paternal Grandfather    Allergies: No Known Allergies Medications: See med rec.  Review of Systems: No headache, visual changes, nausea, vomiting, diarrhea, constipation, dizziness, abdominal pain, skin rash, fevers, chills, night sweats, swollen lymph nodes, weight loss, chest pain, body aches, joint swelling, muscle aches, shortness of  breath, mood changes, visual or auditory hallucinations.  Objective:    General: Well Developed, well nourished, and in no acute distress.  Neuro: Alert and oriented x3, extra-ocular muscles intact, sensation grossly intact. Cranial nerves II through XII are intact, motor, sensory, and coordinative functions are all intact. HEENT: Normocephalic, atraumatic, pupils equal round reactive to light, neck supple, no masses, no lymphadenopathy, thyroid nonpalpable. Oropharynx, nasopharynx, external ear canals are unremarkable. Skin: Warm and dry, no rashes noted.  Cardiac: Regular rate and rhythm, no murmurs rubs or gallops.  Respiratory: Clear to auscultation bilaterally. Not using accessory muscles, speaking in full sentences.  Abdominal: Soft, nontender, nondistended, positive bowel sounds, no masses, no organomegaly.  Musculoskeletal: Shoulder, elbow, wrist, hip, knee, ankle stable, and with full range of motion.  Impression and Recommendations:    Wellness examination The patient was counseled, risk factors were discussed, anticipatory guidance given. Up-to-date on dental visits and vision screening, encouraged to continue this Patient due for flu vaccine, declines today Recommend shingles vaccination, patient is interested, prescription provided for patient to receive at local pharmacy Up-to-date on tetanus booster Had Cologuard last completed in November 2019, will now be due for this given that it has been 3 years, order placed Recommend continuing with regular activity, appropriate diet Requesting refill of Zetia and Crestor today, provided  Plan for follow-up in 1  year to complete CPE or sooner as needed   ___________________________________________ Raigan Baria de [place], MD, ABFM, [facility] Primary Care and Sports Medicine [facility]

## 2021-09-23 DIAGNOSIS — Z1211 Encounter for screening for malignant neoplasm of colon: Secondary | ICD-10-CM | POA: Diagnosis not present

## 2021-10-01 LAB — COLOGUARD: COLOGUARD: NEGATIVE

## 2021-11-23 IMAGING — DX DG LUMBAR SPINE COMPLETE 4+V
5 series · 5 of 5 positions shown · non-contrast
Comparison: None.

CLINICAL DATA: Chronic low back pain which is worsening.

EXAM:
LUMBAR SPINE - COMPLETE 4+ VIEW

[dg lumbar spine complete 4 +v (1 of 5)]
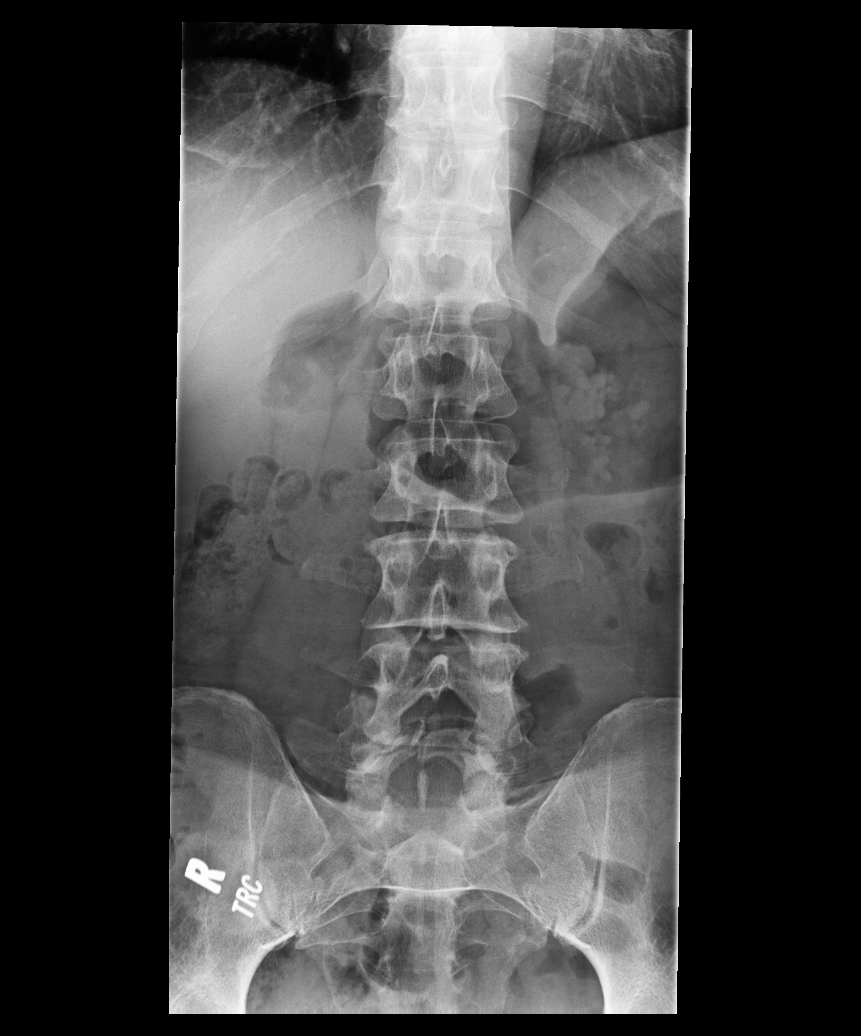

[dg lumbar spine complete 4 +v (2 of 5)]
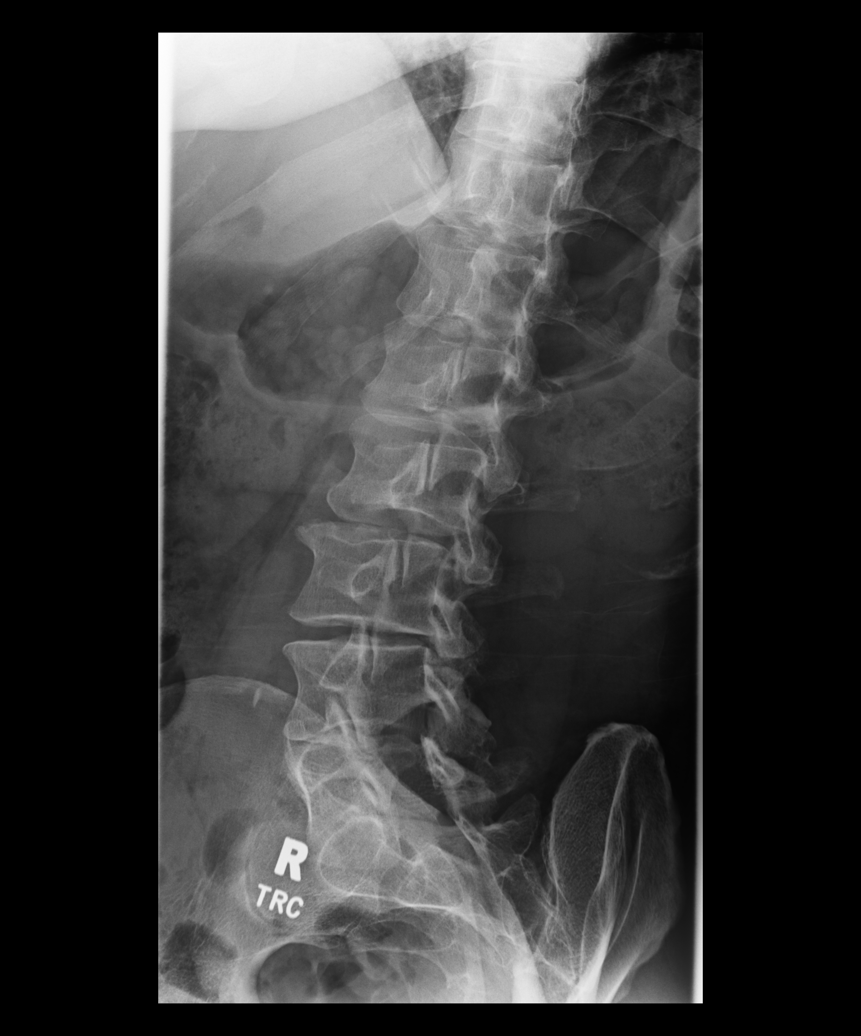

[dg lumbar spine complete 4 +v (3 of 5)]
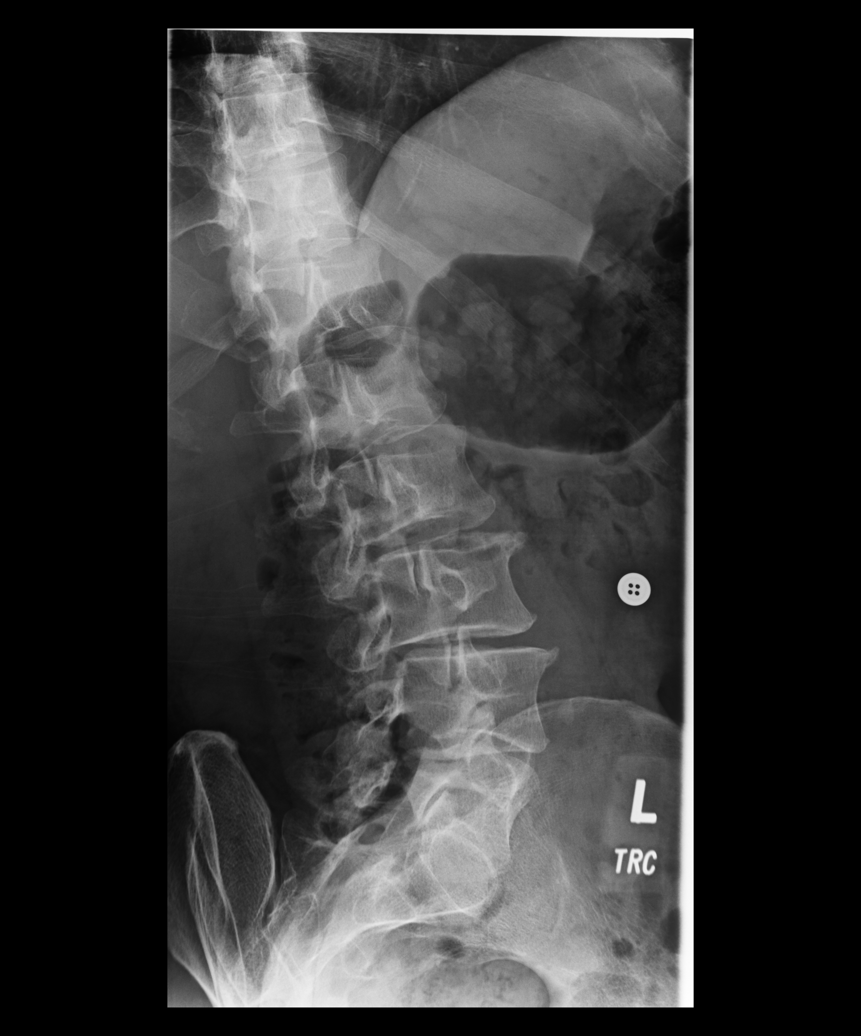

[dg lumbar spine complete 4 +v (4 of 5)]
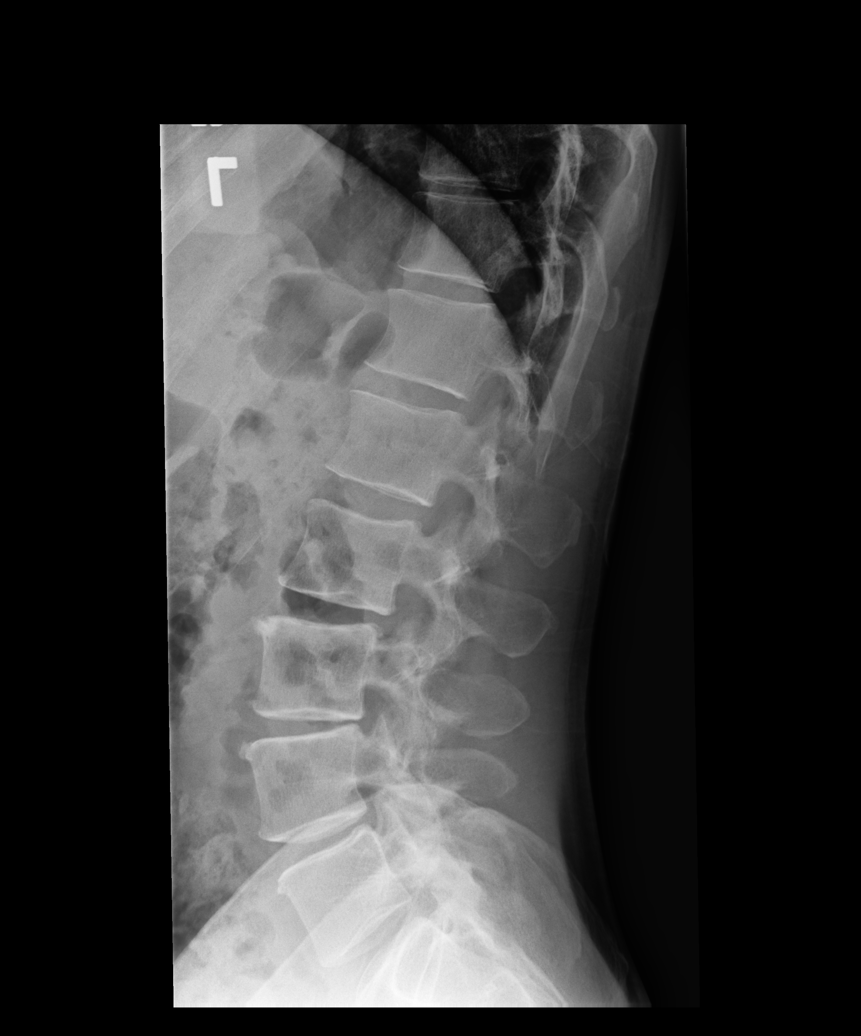

[dg lumbar spine complete 4 +v (5 of 5)]
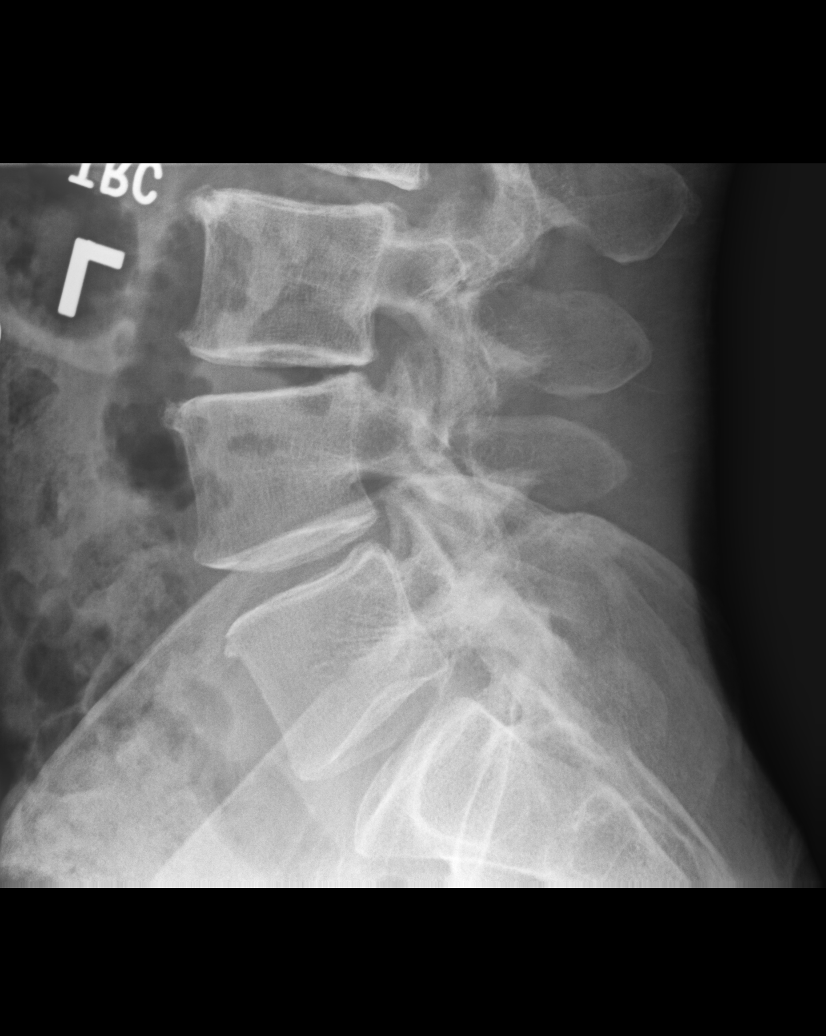

[5 of 5 positions shown; findings below may reference images not displayed]

FINDINGS: Five lumbar type vertebral bodies. 2 mm retrolisthesis L2-3, L3-4
and L4-5. Mild disc space narrowing L2-3, L3-4 and L4-5. Lumbar
facet osteoarthritis. No pars defect. No fracture or focal bone
lesion. Sacroiliac joints appear normal.
IMPRESSION: Lumbar region degenerative disc disease and degenerative facet
disease which could be associated with chronic pain.

## 2022-04-17 ENCOUNTER — Ambulatory Visit (INDEPENDENT_AMBULATORY_CARE_PROVIDER_SITE_OTHER): Payer: BC Managed Care – PPO | Admitting: Family Medicine

## 2022-04-17 ENCOUNTER — Encounter (HOSPITAL_BASED_OUTPATIENT_CLINIC_OR_DEPARTMENT_OTHER): Payer: Self-pay | Admitting: Family Medicine

## 2022-04-17 DIAGNOSIS — G5601 Carpal tunnel syndrome, right upper limb: Secondary | ICD-10-CM | POA: Diagnosis not present

## 2022-04-17 NOTE — Assessment & Plan Note (Addendum)
Patient presents for evaluation of numbness and tingling.  Right hand affected primarily- mostly thumb through ring finger. 3.5-4 weeks Worsened with mountain biking, computer use, resting elbow on arm rest Thinks he has had symptoms previously, generally has gotten better in the past with conservative measures Denies any weakness, not dropping things Patient is right-handed He also feels that he may be having some tingling in his feet at times as well as left hand, this seems to be less consistent.  Also has questions about possible Raynaud's given that sometimes in the cold or with cold water he will have some numbness and tingling in his fingertips and it appears as though they will be slightly white and discolored.  When this happens this is more of a gradual effect in the fingers and not with a sharply demarcated line On exam, patient with good active and passive range of motion in fingers and wrists bilaterally with good strength for intrinsic muscles as well as with flexion and extension at bilateral wrist.  He does have positive Tinel's over right wrist.  Negative Phalen's and reverse Phalen's.  Does also have positive Tinel's over right cubital tunnel. History and exam seem most consistent with carpal tunnel syndrome, however there could be additional underlying nerve entrapments with potential involvement of ulnar nerve at the elbow as well as potential Guyon's canal involvement given association with mountain biking.  Given predominant symptoms, would recommend initial conservative management with nighttime splinting, discussed how to do this Discussed that if symptoms are not improving with conservative measures, further consideration would be for evaluation with neurology and consideration of nerve conduction studies Also discussed possibility of laboratory evaluation, primarily with testing of thyroid function, B12 levels.  We will hold off on this for now

## 2022-04-17 NOTE — Progress Notes (Signed)
    Procedures performed today:    None.  Independent interpretation of notes and tests performed by another provider:   None.  Brief History, Exam, Impression, and Recommendations:    BP 109/76   Pulse (!) 55   Ht 6' 0.5" (1.842 m)   Wt 182 lb 1.6 oz (82.6 kg)   SpO2 100%   BMI 24.36 kg/m   Carpal tunnel syndrome on right Patient presents for evaluation of numbness and tingling.  Right hand affected primarily- mostly thumb through ring finger. 3.5-4 weeks Worsened with mountain biking, computer use, resting elbow on arm rest Thinks he has had symptoms previously, generally has gotten better in the past with conservative measures Denies any weakness, not dropping things Patient is right-handed He also feels that he may be having some tingling in his feet at times as well as left hand, this seems to be less consistent.  Also has questions about possible Raynaud's given that sometimes in the cold or with cold water he will have some numbness and tingling in his fingertips and it appears as though they will be slightly white and discolored.  When this happens this is more of a gradual effect in the fingers and not with a sharply demarcated line On exam, patient with good active and passive range of motion in fingers and wrists bilaterally with good strength for intrinsic muscles as well as with flexion and extension at bilateral wrist.  He does have positive Tinel's over right wrist.  Negative Phalen's and reverse Phalen's.  Does also have positive Tinel's over right cubital tunnel. History and exam seem most consistent with carpal tunnel syndrome, however there could be additional underlying nerve entrapments with potential involvement of ulnar nerve at the elbow as well as potential Guyon's canal involvement given association with mountain biking.  Given predominant symptoms, would recommend initial conservative management with nighttime splinting, discussed how to do this Discussed that  if symptoms are not improving with conservative measures, further consideration would be for evaluation with neurology and consideration of nerve conduction studies Also discussed possibility of laboratory evaluation, primarily with testing of thyroid function, B12 levels.  We will hold off on this for now  Return if symptoms worsen or fail to improve.  Plan for follow-up at previously scheduled appointment   ___________________________________________ Brittanie Dosanjh de [place], MD, ABFM, [facility] Primary Care and Sports Medicine [facility]

## 2022-04-17 NOTE — Patient Instructions (Signed)

## 2022-08-12 ENCOUNTER — Encounter (HOSPITAL_BASED_OUTPATIENT_CLINIC_OR_DEPARTMENT_OTHER): Payer: Self-pay | Admitting: Family Medicine

## 2022-08-12 ENCOUNTER — Ambulatory Visit (INDEPENDENT_AMBULATORY_CARE_PROVIDER_SITE_OTHER): Payer: BC Managed Care – PPO | Admitting: Family Medicine

## 2022-08-12 VITALS — BP 120/81 | HR 55 | Temp 97.8°F | Ht 72.5 in | Wt 177.8 lb

## 2022-08-12 DIAGNOSIS — Z23 Encounter for immunization: Secondary | ICD-10-CM | POA: Diagnosis not present

## 2022-08-12 DIAGNOSIS — Z0000 Encounter for general adult medical examination without abnormal findings: Secondary | ICD-10-CM | POA: Diagnosis not present

## 2022-08-12 MED ORDER — SHINGRIX 50 MCG/0.5ML IM SUSR: 0.5000 mL | Freq: Once | INTRAMUSCULAR | 0 refills | Status: AC

## 2022-08-12 NOTE — Patient Instructions (Signed)
  Medication Instructions:  Your physician recommends that you continue on your current medications as directed. Please refer to the Current Medication list given to you today. --If you need a refill on any your medications before your next appointment, please call your pharmacy first. If no refills are authorized on file call the office.-- Lab Work: Your physician has recommended that you have lab work today: Yes If you have labs (blood work) drawn today and your tests are completely normal, you will receive your results via MyChart message OR a phone call from our staff.  Please ensure you check your voicemail in the event that you authorized detailed messages to be left on a delegated number. If you have any lab test that is abnormal or we need to change your treatment, we will call you to review the results.  Referrals/Procedures/Imaging: No  Follow-Up: Your next appointment:   Your physician recommends that you schedule a follow-up appointment in: 1 year cpe with Dr. de [place].  You will receive a text message or e-mail with a link to a survey about your care and experience with us today! We would greatly appreciate your feedback!   Thanks for letting us be apart of your health journey!!  Primary Care and Sports Medicine   Dr. Raymond de [place]   We encourage you to activate your patient portal called "MyChart".  Sign up information is provided on this After Visit Summary.  MyChart is used to connect with patients for Virtual Visits (Telemedicine).  Patients are able to view lab/test results, encounter notes, upcoming appointments, etc.  Non-urgent messages can be sent to your provider as well. To learn more about what you can do with MyChart, please visit --  [email or web address].    

## 2022-08-12 NOTE — Progress Notes (Signed)
Subjective:    CC: Annual Physical Exam  HPI: Travis Hoffman is a 56 y.o. presenting for annual physical  I reviewed the past medical history, family history, social history, surgical history, and allergies today and no changes were needed.  Please see the problem list section below in epic for further details.  Past Medical History: Past Medical History:  Diagnosis Date   Allergy    RHINITIS   Cardiovascular disease    Dyslipidemia    GIB (gastrointestinal bleeding)    Past Surgical History: History reviewed. No pertinent surgical history. Social History: Social History   Socioeconomic History   Marital status: Married    Spouse name: Not on file   Number of children: Not on file   Years of education: Not on file   Highest education level: Not on file  Occupational History   Not on file  Tobacco Use   Smoking status: Never   Smokeless tobacco: Never  Vaping Use   Vaping Use: Never used  Substance and Sexual Activity   Alcohol use: Yes    Alcohol/week: 5.0 standard drinks of alcohol    Types: 5 Cans of beer per week   Drug use: No   Sexual activity: Yes  Other Topics Concern   Not on file  Social History Narrative   Not on file   Social Determinants of Health   Financial Resource Strain: Not on file  Food Insecurity: Not on file  Transportation Needs: Not on file  Physical Activity: Not on file  Stress: Not on file  Social Connections: Not on file   Family History: Family History  Problem Relation Age of Onset   Arthritis Mother    Heart disease Mother        Stents   Heart disease Father 26       MI   Heart disease Paternal Grandfather    Allergies: No Known Allergies Medications: See med rec.  Review of Systems: No headache, visual changes, nausea, vomiting, diarrhea, constipation, dizziness, abdominal pain, skin rash, fevers, chills, night sweats, swollen lymph nodes, weight loss, chest pain, body aches, joint swelling, muscle aches,  shortness of breath, mood changes, visual or auditory hallucinations.  Objective:    BP 120/81   Pulse (!) 55   Temp 97.8 F (36.6 C) (Oral)   Ht 6' 0.5" (1.842 m)   Wt 177 lb 12.8 oz (80.6 kg)   SpO2 100%   BMI 23.78 kg/m   General: Well Developed, well nourished, and in no acute distress. Neuro: Alert and oriented x3, extra-ocular muscles intact, sensation grossly intact. Cranial nerves II through XII are intact, motor, sensory, and coordinative functions are all intact. HEENT: Normocephalic, atraumatic, pupils equal round reactive to light, neck supple, no masses, no lymphadenopathy, thyroid nonpalpable. Oropharynx, nasopharynx, external ear canals are unremarkable. Skin: Warm and dry, no rashes noted. Cardiac: Regular rate and rhythm, no murmurs rubs or gallops. Respiratory: Clear to auscultation bilaterally. Not using accessory muscles, speaking in full sentences. Abdominal: Soft, nontender, nondistended, positive bowel sounds, no masses, no organomegaly. Musculoskeletal: Shoulder, elbow, wrist, hip, knee, ankle stable, and with full range of motion.  Impression and Recommendations:    Wellness examination Routine HCM labs ordered. HCM reviewed/discussed. Anticipatory guidance regarding healthy weight, lifestyle and choices given. Recommend healthy diet.  Recommend approximately 150 minutes/week of moderate intensity exercise Recommend regular dental and vision exams Always use seatbelt/lap and shoulder restraints Recommend using smoke alarms and checking batteries at least twice a year Recommend using  sunscreen when outside Discussed colon cancer screening recommendations, options.  Patient is UTD Discussed recommendations for shingles vaccine.  Patient amenable, Rx sent to pharmacy Discussed tetanus immunization recommendations, patient agreed to proceed with this today Recommend seasonal flu vaccine, patient declines today  Return in about 1 year (around 08/13/2023) for  CPE.   ___________________________________________ Mattie Nordell de [place], MD, ABFM, CAQSM [facility] [place]

## 2022-08-12 NOTE — Assessment & Plan Note (Addendum)
Routine HCM labs ordered. HCM reviewed/discussed. Anticipatory guidance regarding healthy weight, lifestyle and choices given. Recommend healthy diet.  Recommend approximately 150 minutes/week of moderate intensity exercise Recommend regular dental and vision exams Always use seatbelt/lap and shoulder restraints Recommend using smoke alarms and checking batteries at least twice a year Recommend using sunscreen when outside Discussed colon cancer screening recommendations, options.  Patient is UTD Discussed recommendations for shingles vaccine.  Patient amenable, Rx sent to pharmacy Discussed tetanus immunization recommendations, patient agreed to proceed with this today Recommend seasonal flu vaccine, patient declines today

## 2022-08-13 LAB — CBC WITH DIFFERENTIAL/PLATELET
Basophils Absolute: 0 10*3/uL (ref 0.0–0.2)
Basos: 1 %
EOS (ABSOLUTE): 0.2 10*3/uL (ref 0.0–0.4)
Eos: 3 %
Hematocrit: 41 % (ref 37.5–51.0)
Hemoglobin: 13.2 g/dL (ref 13.0–17.7)
Immature Grans (Abs): 0 10*3/uL (ref 0.0–0.1)
Immature Granulocytes: 0 %
Lymphocytes Absolute: 2 10*3/uL (ref 0.7–3.1)
Lymphs: 38 %
MCH: 26.5 pg — ABNORMAL LOW (ref 26.6–33.0)
MCHC: 32.2 g/dL (ref 31.5–35.7)
MCV: 82 fL (ref 79–97)
Monocytes Absolute: 0.4 10*3/uL (ref 0.1–0.9)
Monocytes: 9 %
Neutrophils Absolute: 2.6 10*3/uL (ref 1.4–7.0)
Neutrophils: 49 %
Platelets: 261 10*3/uL (ref 150–450)
RBC: 4.99 x10E6/uL (ref 4.14–5.80)
RDW: 13.7 % (ref 11.6–15.4)
WBC: 5.2 10*3/uL (ref 3.4–10.8)

## 2022-08-13 LAB — COMPREHENSIVE METABOLIC PANEL
ALT: 29 IU/L (ref 0–44)
AST: 38 IU/L (ref 0–40)
Albumin/Globulin Ratio: 2.1 (ref 1.2–2.2)
Albumin: 4.6 g/dL (ref 3.8–4.9)
Alkaline Phosphatase: 53 IU/L (ref 44–121)
BUN/Creatinine Ratio: 21 — ABNORMAL HIGH (ref 9–20)
BUN: 22 mg/dL (ref 6–24)
Bilirubin Total: 0.5 mg/dL (ref 0.0–1.2)
CO2: 24 mmol/L (ref 20–29)
Calcium: 9.2 mg/dL (ref 8.7–10.2)
Chloride: 101 mmol/L (ref 96–106)
Creatinine, Ser: 1.05 mg/dL (ref 0.76–1.27)
Globulin, Total: 2.2 g/dL (ref 1.5–4.5)
Glucose: 99 mg/dL (ref 70–99)
Potassium: 4.4 mmol/L (ref 3.5–5.2)
Sodium: 137 mmol/L (ref 134–144)
Total Protein: 6.8 g/dL (ref 6.0–8.5)
eGFR: 84 mL/min/{1.73_m2} (ref >59)

## 2022-08-13 LAB — LIPID PANEL
Chol/HDL Ratio: 2.9 ratio (ref 0.0–5.0)
Cholesterol, Total: 161 mg/dL (ref 100–199)
HDL: 56 mg/dL (ref >39)
LDL Chol Calc (NIH): 94 mg/dL (ref 0–99)
Triglycerides: 56 mg/dL (ref 0–149)
VLDL Cholesterol Cal: 11 mg/dL (ref 5–40)

## 2022-08-13 LAB — TSH RFX ON ABNORMAL TO FREE T4: TSH: 0.862 u[IU]/mL (ref 0.450–4.500)

## 2022-09-11 ENCOUNTER — Encounter (HOSPITAL_BASED_OUTPATIENT_CLINIC_OR_DEPARTMENT_OTHER): Payer: Self-pay

## 2022-09-24 ENCOUNTER — Encounter (HOSPITAL_BASED_OUTPATIENT_CLINIC_OR_DEPARTMENT_OTHER): Payer: Self-pay | Admitting: Family Medicine

## 2022-11-22 ENCOUNTER — Telehealth (HOSPITAL_BASED_OUTPATIENT_CLINIC_OR_DEPARTMENT_OTHER): Payer: Self-pay | Admitting: Family Medicine

## 2022-11-22 NOTE — Telephone Encounter (Signed)
Pt advised no to the FLU Vaccine

## 2023-08-15 ENCOUNTER — Encounter (HOSPITAL_BASED_OUTPATIENT_CLINIC_OR_DEPARTMENT_OTHER): Payer: BC Managed Care – PPO | Admitting: Family Medicine

## 2023-08-18 ENCOUNTER — Ambulatory Visit (INDEPENDENT_AMBULATORY_CARE_PROVIDER_SITE_OTHER): Payer: BC Managed Care – PPO | Admitting: Family Medicine

## 2023-08-18 ENCOUNTER — Encounter (HOSPITAL_BASED_OUTPATIENT_CLINIC_OR_DEPARTMENT_OTHER): Payer: Self-pay | Admitting: Family Medicine

## 2023-08-18 VITALS — BP 127/91 | HR 60 | Ht 72.5 in | Wt 178.4 lb

## 2023-08-18 DIAGNOSIS — E7841 Elevated Lipoprotein(a): Secondary | ICD-10-CM

## 2023-08-18 DIAGNOSIS — E785 Hyperlipidemia, unspecified: Secondary | ICD-10-CM | POA: Diagnosis not present

## 2023-08-18 DIAGNOSIS — Z0000 Encounter for general adult medical examination without abnormal findings: Secondary | ICD-10-CM

## 2023-08-18 DIAGNOSIS — G8929 Other chronic pain: Secondary | ICD-10-CM

## 2023-08-18 DIAGNOSIS — M545 Low back pain, unspecified: Secondary | ICD-10-CM

## 2023-08-18 NOTE — Assessment & Plan Note (Addendum)
Routine HCM labs ordered. HCM reviewed/discussed. Anticipatory guidance regarding healthy weight, lifestyle and choices given. Recommend healthy diet.  Recommend approximately 150 minutes/week of moderate intensity exercise Recommend regular dental and vision exams Always use seatbelt/lap and shoulder restraints Recommend using smoke alarms and checking batteries at least twice a year Recommend using sunscreen when outside Discussed colon cancer screening recommendations, options.  Patient is UTD Discussed immunizations recommendations for shingles vaccine Discussed tetanus immunization recommendations, patient is UTD Recommend seasonal flu vaccine, patient declines today

## 2023-08-18 NOTE — Progress Notes (Signed)
Subjective:    CC: Annual Physical Exam  HPI:  Travis Hoffman is a 57 y.o. presenting for annual physical  I reviewed the past medical history, family history, social history, surgical history, and allergies today and no changes were needed.  Please see the problem list section below in epic for further details.  Past Medical History: Past Medical History:  Diagnosis Date   Allergy    RHINITIS   Cardiovascular disease    Dyslipidemia    GIB (gastrointestinal bleeding)    Past Surgical History: History reviewed. No pertinent surgical history. Social History: Social History   Socioeconomic History   Marital status: Married    Spouse name: Not on file   Number of children: Not on file   Years of education: Not on file   Highest education level: Not on file  Occupational History   Not on file  Tobacco Use   Smoking status: Never    Passive exposure: Never   Smokeless tobacco: Never  Vaping Use   Vaping status: Never Used  Substance and Sexual Activity   Alcohol use: Yes    Alcohol/week: 5.0 standard drinks of alcohol    Types: 5 Cans of beer per week   Drug use: No   Sexual activity: Yes  Other Topics Concern   Not on file  Social History Narrative   Not on file   Social Determinants of Health   Financial Resource Strain: Low Risk  (08/18/2023)   Overall Financial Resource Strain (CARDIA)    Difficulty of Paying Living Expenses: Not hard at all  Food Insecurity: No Food Insecurity (08/18/2023)   Hunger Vital Sign    Worried About Running Out of Food in the Last Year: Never true    Ran Out of Food in the Last Year: Never true  Transportation Needs: No Transportation Needs (08/18/2023)   PRAPARE - Administrator, Civil Service (Medical): No    Lack of Transportation (Non-Medical): No  Physical Activity: Sufficiently Active (08/18/2023)   Exercise Vital Sign    Days of Exercise per Week: 7 days    Minutes of Exercise per Session: 60 min  Stress: No  Stress Concern Present (08/18/2023)   [employer] of Occupational Health - Occupational Stress Questionnaire    Feeling of Stress : Not at all  Social Connections: Moderately Integrated (08/18/2023)   Social Connection and Isolation Panel [NHANES]    Frequency of Communication with Friends and Family: More than three times a week    Frequency of Social Gatherings with Friends and Family: Once a week    Attends Religious Services: Never    Database administrator or Organizations: Yes    Attends Engineer, structural: More than 4 times per year    Marital Status: Married   Family History: Family History  Problem Relation Age of Onset   Arthritis Mother    Heart disease Mother        Stents   Heart disease Father 15       MI   Heart disease Paternal Grandfather    Allergies: No Known Allergies Medications: See med rec.  Review of Systems: No headache, visual changes, nausea, vomiting, diarrhea, constipation, dizziness, abdominal pain, skin rash, fevers, chills, night sweats, swollen lymph nodes, weight loss, chest pain, body aches, joint swelling, muscle aches, shortness of breath, mood changes, visual or auditory hallucinations.  Objective:    BP (!) 127/91 (BP Location: Right Arm, Patient Position: Sitting, Cuff Size:  Normal)   Pulse 60   Ht 6' 0.5" (1.842 m)   Wt 178 lb 6.4 oz (80.9 kg)   SpO2 100%   BMI 23.86 kg/m   General: Well Developed, well nourished, and in no acute distress. Neuro: Alert and oriented x3, extra-ocular muscles intact, sensation grossly intact. Cranial nerves II through XII are intact, motor, sensory, and coordinative functions are all intact. HEENT: Normocephalic, atraumatic, pupils equal round reactive to light, neck supple, no masses, no lymphadenopathy, thyroid nonpalpable. Oropharynx, nasopharynx, external ear canals are unremarkable. Skin: Warm and dry, no rashes noted. Cardiac: Regular rate and rhythm, no murmurs rubs or  gallops. Respiratory: Clear to auscultation bilaterally. Not using accessory muscles, speaking in full sentences. Abdominal: Soft, nontender, nondistended, positive bowel sounds, no masses, no organomegaly. Musculoskeletal: Shoulder, elbow, wrist, hip, knee, ankle stable, and with full range of motion.  Impression and Recommendations:    Wellness examination Assessment & Plan: Routine HCM labs ordered. HCM reviewed/discussed. Anticipatory guidance regarding healthy weight, lifestyle and choices given. Recommend healthy diet.  Recommend approximately 150 minutes/week of moderate intensity exercise Recommend regular dental and vision exams Always use seatbelt/lap and shoulder restraints Recommend using smoke alarms and checking batteries at least twice a year Recommend using sunscreen when outside Discussed colon cancer screening recommendations, options.  Patient is UTD Discussed immunizations recommendations for shingles vaccine Discussed tetanus immunization recommendations, patient is UTD Recommend seasonal flu vaccine, patient declines today  Orders: -     CBC with Differential/Platelet; Future -     Comprehensive metabolic panel; Future -     Lipid panel; Future -     TSH Rfx on Abnormal to Free T4; Future -     NMR LipoProf + Graph; Future -     Hemoglobin A1c; Future  Chronic bilateral low back pain, unspecified whether sciatica present Assessment & Plan: Patient with intermittent issues.  Has had observed degenerative changes on lumbar spine x-ray in the past.  Currently utilizing home exercise regimen.  Generally indicates that pain is fairly well-controlled.  He does wonder if there is a role for physical therapy to help with managing underlying diagnosis.  We discussed potential benefits related to PT.  He is interested in referral, referral placed today.  Orders: -     Ambulatory referral to Physical Therapy  Hyperlipidemia LDL goal <70 -     NMR LipoProf + Graph;  Future  Elevated lipoprotein(a) -     NMR LipoProf + Graph; Future  Return in about 1 year (around 08/17/2024) for CPE.   ___________________________________________ Binh Doten de [place], MD, ABFM, [facility] Primary Care and Sports Medicine [facility]

## 2023-08-18 NOTE — Assessment & Plan Note (Signed)
Patient with intermittent issues.  Has had observed degenerative changes on lumbar spine x-ray in the past.  Currently utilizing home exercise regimen.  Generally indicates that pain is fairly well-controlled.  He does wonder if there is a role for physical therapy to help with managing underlying diagnosis.  We discussed potential benefits related to PT.  He is interested in referral, referral placed today.

## 2023-08-21 ENCOUNTER — Other Ambulatory Visit (HOSPITAL_BASED_OUTPATIENT_CLINIC_OR_DEPARTMENT_OTHER): Payer: Self-pay | Admitting: Family Medicine

## 2023-08-21 DIAGNOSIS — Z0000 Encounter for general adult medical examination without abnormal findings: Secondary | ICD-10-CM | POA: Diagnosis not present

## 2023-08-21 DIAGNOSIS — E7841 Elevated Lipoprotein(a): Secondary | ICD-10-CM | POA: Diagnosis not present

## 2023-08-21 DIAGNOSIS — E785 Hyperlipidemia, unspecified: Secondary | ICD-10-CM | POA: Diagnosis not present

## 2023-08-22 LAB — TSH RFX ON ABNORMAL TO FREE T4: TSH: 1.22 u[IU]/mL (ref 0.450–4.500)

## 2023-08-22 LAB — LIPID PANEL
Chol/HDL Ratio: 4 ratio (ref 0.0–5.0)
Cholesterol, Total: 246 mg/dL — ABNORMAL HIGH (ref 100–199)
HDL: 61 mg/dL (ref >39)
LDL Chol Calc (NIH): 175 mg/dL — ABNORMAL HIGH (ref 0–99)
Triglycerides: 60 mg/dL (ref 0–149)
VLDL Cholesterol Cal: 10 mg/dL (ref 5–40)

## 2023-08-22 LAB — CBC WITH DIFFERENTIAL/PLATELET
Basophils Absolute: 0 10*3/uL (ref 0.0–0.2)
Basos: 1 %
EOS (ABSOLUTE): 0.2 10*3/uL (ref 0.0–0.4)
Eos: 4 %
Hematocrit: 42.4 % (ref 37.5–51.0)
Hemoglobin: 13.7 g/dL (ref 13.0–17.7)
Immature Grans (Abs): 0 10*3/uL (ref 0.0–0.1)
Immature Granulocytes: 0 %
Lymphocytes Absolute: 1.9 10*3/uL (ref 0.7–3.1)
Lymphs: 40 %
MCH: 26.9 pg (ref 26.6–33.0)
MCHC: 32.3 g/dL (ref 31.5–35.7)
MCV: 83 fL (ref 79–97)
Monocytes Absolute: 0.4 10*3/uL (ref 0.1–0.9)
Monocytes: 9 %
Neutrophils Absolute: 2.2 10*3/uL (ref 1.4–7.0)
Neutrophils: 46 %
Platelets: 265 10*3/uL (ref 150–450)
RBC: 5.09 x10E6/uL (ref 4.14–5.80)
RDW: 13.1 % (ref 11.6–15.4)
WBC: 4.8 10*3/uL (ref 3.4–10.8)

## 2023-08-22 LAB — NMR LIPOPROF + GRAPH
Cholesterol, Total: 255 mg/dL — ABNORMAL HIGH (ref 100–199)
HDL Particle Number: 34.3 umol/L (ref >=30.5)
HDL-C: 63 mg/dL (ref >39)
LDL Particle Number: 1681 nmol/L — ABNORMAL HIGH (ref <1000)
LDL Size: 21.8 nm (ref >20.5)
LDL-C (NIH Calc): 182 mg/dL — ABNORMAL HIGH (ref 0–99)
LP-IR Score: <25 (ref <=45)
Small LDL Particle Number: 251 nmol/L (ref <=527)
Triglycerides: 61 mg/dL (ref 0–149)

## 2023-08-22 LAB — COMPREHENSIVE METABOLIC PANEL
ALT: 19 [IU]/L (ref 0–44)
AST: 26 [IU]/L (ref 0–40)
Albumin: 4.4 g/dL (ref 3.8–4.9)
Alkaline Phosphatase: 53 [IU]/L (ref 44–121)
BUN/Creatinine Ratio: 19 (ref 9–20)
BUN: 23 mg/dL (ref 6–24)
Bilirubin Total: 0.5 mg/dL (ref 0.0–1.2)
CO2: 21 mmol/L (ref 20–29)
Calcium: 9.3 mg/dL (ref 8.7–10.2)
Chloride: 104 mmol/L (ref 96–106)
Creatinine, Ser: 1.2 mg/dL (ref 0.76–1.27)
Globulin, Total: 2 g/dL (ref 1.5–4.5)
Glucose: 101 mg/dL — ABNORMAL HIGH (ref 70–99)
Potassium: 4.6 mmol/L (ref 3.5–5.2)
Sodium: 138 mmol/L (ref 134–144)
Total Protein: 6.4 g/dL (ref 6.0–8.5)
eGFR: 71 mL/min/{1.73_m2} (ref >59)

## 2023-08-22 LAB — HEMOGLOBIN A1C
Est. average glucose Bld gHb Est-mCnc: 123 mg/dL
Hgb A1c MFr Bld: 5.9 % — ABNORMAL HIGH (ref 4.8–5.6)

## 2023-12-04 ENCOUNTER — Encounter (HOSPITAL_BASED_OUTPATIENT_CLINIC_OR_DEPARTMENT_OTHER): Payer: Self-pay | Admitting: Family Medicine

## 2023-12-05 ENCOUNTER — Other Ambulatory Visit (HOSPITAL_BASED_OUTPATIENT_CLINIC_OR_DEPARTMENT_OTHER): Payer: Self-pay | Admitting: *Deleted

## 2023-12-05 DIAGNOSIS — Z8249 Family history of ischemic heart disease and other diseases of the circulatory system: Secondary | ICD-10-CM

## 2023-12-05 MED ORDER — EZETIMIBE 10 MG PO TABS: 10.0000 mg | ORAL_TABLET | Freq: Every day | ORAL | 3 refills | Status: DC

## 2023-12-05 MED ORDER — ROSUVASTATIN CALCIUM 10 MG PO TABS: 10.0000 mg | ORAL_TABLET | Freq: Every day | ORAL | 3 refills | Status: DC

## 2024-01-07 DIAGNOSIS — M62838 Other muscle spasm: Secondary | ICD-10-CM | POA: Diagnosis not present

## 2024-01-07 DIAGNOSIS — M9901 Segmental and somatic dysfunction of cervical region: Secondary | ICD-10-CM | POA: Diagnosis not present

## 2024-01-07 DIAGNOSIS — M9902 Segmental and somatic dysfunction of thoracic region: Secondary | ICD-10-CM | POA: Diagnosis not present

## 2024-01-07 DIAGNOSIS — M542 Cervicalgia: Secondary | ICD-10-CM | POA: Diagnosis not present

## 2024-06-07 DIAGNOSIS — H2512 Age-related nuclear cataract, left eye: Secondary | ICD-10-CM | POA: Diagnosis not present

## 2024-06-07 DIAGNOSIS — Z961 Presence of intraocular lens: Secondary | ICD-10-CM | POA: Diagnosis not present

## 2024-08-09 DIAGNOSIS — F4320 Adjustment disorder, unspecified: Secondary | ICD-10-CM | POA: Diagnosis not present

## 2024-08-18 ENCOUNTER — Encounter (HOSPITAL_BASED_OUTPATIENT_CLINIC_OR_DEPARTMENT_OTHER): Payer: Self-pay | Admitting: Family Medicine

## 2024-08-18 ENCOUNTER — Ambulatory Visit (INDEPENDENT_AMBULATORY_CARE_PROVIDER_SITE_OTHER): Payer: BC Managed Care – PPO | Admitting: Family Medicine

## 2024-08-18 VITALS — BP 115/79 | HR 64 | Temp 97.6°F | Resp 18 | Ht 72.5 in | Wt 183.0 lb

## 2024-08-18 DIAGNOSIS — R7303 Prediabetes: Secondary | ICD-10-CM

## 2024-08-18 DIAGNOSIS — E785 Hyperlipidemia, unspecified: Secondary | ICD-10-CM | POA: Diagnosis not present

## 2024-08-18 DIAGNOSIS — Z8249 Family history of ischemic heart disease and other diseases of the circulatory system: Secondary | ICD-10-CM

## 2024-08-18 DIAGNOSIS — Z1211 Encounter for screening for malignant neoplasm of colon: Secondary | ICD-10-CM | POA: Diagnosis not present

## 2024-08-18 DIAGNOSIS — Z0000 Encounter for general adult medical examination without abnormal findings: Secondary | ICD-10-CM | POA: Diagnosis not present

## 2024-08-18 MED ORDER — EZETIMIBE 10 MG PO TABS: 10.0000 mg | ORAL_TABLET | Freq: Every day | ORAL | 3 refills | Status: AC

## 2024-08-18 MED ORDER — ROSUVASTATIN CALCIUM 10 MG PO TABS: 10.0000 mg | ORAL_TABLET | Freq: Every day | ORAL | 3 refills | Status: AC

## 2024-08-18 NOTE — Progress Notes (Signed)
 Subjective:    CC: Annual Physical Exam  HPI:  Travis Hoffman is a 58 y.o. presenting for annual physical  I reviewed the past medical history, family history, social history, surgical history, and allergies today and no changes were needed.  Please see the problem list section below in epic for further details.  Past Medical History: Past Medical History:  Diagnosis Date   Allergy    RHINITIS   Cardiovascular disease    Dyslipidemia    GIB (gastrointestinal bleeding)    Past Surgical History: History reviewed. No pertinent surgical history. Social History: Social History   Socioeconomic History   Marital status: Married    Spouse name: Not on file   Number of children: Not on file   Years of education: Not on file   Highest education level: Not on file  Occupational History   Not on file  Tobacco Use   Smoking status: Never    Passive exposure: Never   Smokeless tobacco: Never  Vaping Use   Vaping status: Never Used  Substance and Sexual Activity   Alcohol use: Yes    Alcohol/week: 5.0 standard drinks of alcohol    Types: 5 Cans of beer per week   Drug use: No   Sexual activity: Yes  Other Topics Concern   Not on file  Social History Narrative   Not on file   Social Drivers of Health   Financial Resource Strain: Low Risk  (08/18/2023)   Overall Financial Resource Strain (CARDIA)    Difficulty of Paying Living Expenses: Not hard at all  Food Insecurity: No Food Insecurity (08/18/2023)   Hunger Vital Sign    Worried About Running Out of Food in the Last Year: Never true    Ran Out of Food in the Last Year: Never true  Transportation Needs: No Transportation Needs (08/18/2023)   PRAPARE - Administrator, Civil Service (Medical): No    Lack of Transportation (Non-Medical): No  Physical Activity: Sufficiently Active (08/18/2023)   Exercise Vital Sign    Days of Exercise per Week: 7 days    Minutes of Exercise per Session: 60 min  Stress: No  Stress Concern Present (08/18/2023)   [employer] of Occupational Health - Occupational Stress Questionnaire    Feeling of Stress : Not at all  Social Connections: Moderately Integrated (08/18/2023)   Social Connection and Isolation Panel    Frequency of Communication with Friends and Family: More than three times a week    Frequency of Social Gatherings with Friends and Family: Once a week    Attends Religious Services: Never    Database Administrator or Organizations: Yes    Attends Engineer, Structural: More than 4 times per year    Marital Status: Married   Family History: Family History  Problem Relation Age of Onset   Arthritis Mother    Heart disease Mother        Stents   Heart disease Father 31       MI   Heart disease Paternal Grandfather    Allergies: No Known Allergies Medications: See med rec.  Review of Systems: No headache, visual changes, nausea, vomiting, diarrhea, constipation, dizziness, abdominal pain, skin rash, fevers, chills, night sweats, swollen lymph nodes, weight loss, chest pain, body aches, joint swelling, muscle aches, shortness of breath, mood changes, visual or auditory hallucinations.  Objective:    BP 115/79 (BP Location: Left Arm, Patient Position: Sitting, Cuff Size: Normal)  Pulse 64   Temp 97.6 F (36.4 C) (Oral)   Resp 18   Ht 6' 0.5 (1.842 m)   Wt 183 lb (83 kg)   SpO2 100%   BMI 24.48 kg/m   General: Well Developed, well nourished, and in no acute distress. Neuro: Alert and oriented x3, extra-ocular muscles intact, sensation grossly intact. Cranial nerves II through XII are intact, motor, sensory, and coordinative functions are all intact. HEENT: Normocephalic, atraumatic, pupils equal round reactive to light, neck supple, no masses, no lymphadenopathy, thyroid nonpalpable. Oropharynx, nasopharynx, external ear canals are unremarkable. Skin: Warm and dry, no rashes noted. Cardiac: Regular rate and rhythm, no murmurs  rubs or gallops. Respiratory: Clear to auscultation bilaterally. Not using accessory muscles, speaking in full sentences. Abdominal: Soft, nontender, nondistended, positive bowel sounds, no masses, no organomegaly. Musculoskeletal: Shoulder, elbow, wrist, hip, knee, ankle stable, and with full range of motion.  Impression and Recommendations:    Wellness examination Assessment & Plan: Routine HCM labs ordered. HCM reviewed/discussed. Anticipatory guidance regarding healthy weight, lifestyle and choices given. Recommend healthy diet.  Recommend approximately 150 minutes/week of moderate intensity exercise Recommend regular dental and vision exams Always use seatbelt/lap and shoulder restraints Recommend using smoke alarms and checking batteries at least twice a year Recommend using sunscreen when outside Discussed colon cancer screening recommendations, options.  Patient is UTD Discussed immunization recommendations  Orders: -     CBC with Differential/Platelet -     Comprehensive metabolic panel with GFR -     Hemoglobin A1c -     Lipid panel -     TSH Rfx on Abnormal to Free T4 -     NMR LipoProf + Graph -     Insulin, random  Family history of heart disease in male family member before age 23 -     Ezetimibe ; Take 1 tablet (10 mg total) by mouth daily.  Dispense: 90 tablet; Refill: 3 -     [facility]B [facility]erral to [facility]  Colon cancer screening -     Cologuard  Prediabetes -     Insulin, random  Hyperlipidemia LDL goal <70 -     NMR LipoProf + Graph -     AMB Referral to [facility]  Other orders -     Rosuvastatin  Calcium ; Take 1 tablet (10 mg total) by mouth daily.  Dispense: 90 tablet; Refill: 3  Patient with additional questions today regarding cholesterol management.  Did review records and he has met with Dr. Anan[facility] in the past with discussion at that time including Repatha and his management option.  On discussing this  today, patient is not quite clear on recalling details of this visit.  We discussed considerations today and given his family history and his concerns, I do think it would be appropriate to have reevaluation with lipid clinic, referral placed today.  Return in about 1 year (around 08/18/2025) for CPE, 40 minutes.   ___________________________________________ Arlis Everly de [place], MD, ABFM, CAQSM Primary Care and Sports Medicine [facility]

## 2024-08-19 LAB — INSULIN, RANDOM: INSULIN: 2.8 u[IU]/mL (ref 2.6–24.9)

## 2024-08-19 LAB — CBC WITH DIFFERENTIAL/PLATELET
Basophils Absolute: 0 x10E3/uL (ref 0.0–0.2)
Basos: 1 %
EOS (ABSOLUTE): 0.2 x10E3/uL (ref 0.0–0.4)
Eos: 4 %
Hematocrit: 43.2 % (ref 37.5–51.0)
Hemoglobin: 13.7 g/dL (ref 13.0–17.7)
Immature Grans (Abs): 0 x10E3/uL (ref 0.0–0.1)
Immature Granulocytes: 0 %
Lymphocytes Absolute: 2.1 x10E3/uL (ref 0.7–3.1)
Lymphs: 37 %
MCH: 26.6 pg (ref 26.6–33.0)
MCHC: 31.7 g/dL (ref 31.5–35.7)
MCV: 84 fL (ref 79–97)
Monocytes Absolute: 0.6 x10E3/uL (ref 0.1–0.9)
Monocytes: 10 %
Neutrophils Absolute: 2.9 x10E3/uL (ref 1.4–7.0)
Neutrophils: 48 %
Platelets: 284 x10E3/uL (ref 150–450)
RBC: 5.16 x10E6/uL (ref 4.14–5.80)
RDW: 13 % (ref 11.6–15.4)
WBC: 5.9 x10E3/uL (ref 3.4–10.8)

## 2024-08-19 LAB — COMPREHENSIVE METABOLIC PANEL WITH GFR
ALT: 21 IU/L (ref 0–44)
AST: 30 IU/L (ref 0–40)
Albumin: 4.6 g/dL (ref 3.8–4.9)
Alkaline Phosphatase: 60 IU/L (ref 47–123)
BUN/Creatinine Ratio: 15 (ref 9–20)
BUN: 19 mg/dL (ref 6–24)
Bilirubin Total: 0.7 mg/dL (ref 0.0–1.2)
CO2: 22 mmol/L (ref 20–29)
Calcium: 9.5 mg/dL (ref 8.7–10.2)
Chloride: 102 mmol/L (ref 96–106)
Creatinine, Ser: 1.23 mg/dL (ref 0.76–1.27)
Globulin, Total: 2.5 g/dL (ref 1.5–4.5)
Glucose: 89 mg/dL (ref 70–99)
Potassium: 4.6 mmol/L (ref 3.5–5.2)
Sodium: 137 mmol/L (ref 134–144)
Total Protein: 7.1 g/dL (ref 6.0–8.5)
eGFR: 68 mL/min/1.73 (ref >59)

## 2024-08-19 LAB — NMR LIPOPROF + GRAPH
Cholesterol, Total: 201 mg/dL — ABNORMAL HIGH (ref 100–199)
HDL Particle Number: 38.3 umol/L (ref >=30.5)
HDL-C: 67 mg/dL (ref >39)
LDL Particle Number: 1234 nmol/L — ABNORMAL HIGH (ref <1000)
LDL Size: 21.5 nm (ref >20.5)
LDL-C (NIH Calc): 124 mg/dL — ABNORMAL HIGH (ref 0–99)
LP-IR Score: <25 (ref <=45)
Small LDL Particle Number: 353 nmol/L (ref <=527)
Triglycerides: 56 mg/dL (ref 0–149)

## 2024-08-19 LAB — LIPID PANEL
Chol/HDL Ratio: 3.2 ratio (ref 0.0–5.0)
Cholesterol, Total: 193 mg/dL (ref 100–199)
HDL: 61 mg/dL (ref >39)
LDL Chol Calc (NIH): 121 mg/dL — ABNORMAL HIGH (ref 0–99)
Triglycerides: 57 mg/dL (ref 0–149)
VLDL Cholesterol Cal: 11 mg/dL (ref 5–40)

## 2024-08-19 LAB — HEMOGLOBIN A1C
Est. average glucose Bld gHb Est-mCnc: 117 mg/dL
Hgb A1c MFr Bld: 5.7 % — ABNORMAL HIGH (ref 4.8–5.6)

## 2024-08-19 LAB — TSH RFX ON ABNORMAL TO FREE T4: TSH: 1.44 u[IU]/mL (ref 0.450–4.500)

## 2024-08-26 ENCOUNTER — Ambulatory Visit (HOSPITAL_BASED_OUTPATIENT_CLINIC_OR_DEPARTMENT_OTHER): Payer: Self-pay | Admitting: Family Medicine

## 2024-08-26 NOTE — Assessment & Plan Note (Signed)
 Routine HCM labs ordered. HCM reviewed/discussed. Anticipatory guidance regarding healthy weight, lifestyle and choices given. Recommend healthy diet.  Recommend approximately 150 minutes/week of moderate intensity exercise Recommend regular dental and vision exams Always use seatbelt/lap and shoulder restraints Recommend using smoke alarms and checking batteries at least twice a year Recommend using sunscreen when outside Discussed colon cancer screening recommendations, options.  Patient is UTD Discussed immunization recommendations

## 2024-08-31 DIAGNOSIS — F4320 Adjustment disorder, unspecified: Secondary | ICD-10-CM | POA: Diagnosis not present

## 2024-09-14 DIAGNOSIS — F4320 Adjustment disorder, unspecified: Secondary | ICD-10-CM | POA: Diagnosis not present

## 2024-09-22 ENCOUNTER — Encounter (HOSPITAL_BASED_OUTPATIENT_CLINIC_OR_DEPARTMENT_OTHER): Payer: Self-pay

## 2024-09-23 DIAGNOSIS — Z1211 Encounter for screening for malignant neoplasm of colon: Secondary | ICD-10-CM | POA: Diagnosis not present

## 2024-09-27 ENCOUNTER — Encounter (HOSPITAL_BASED_OUTPATIENT_CLINIC_OR_DEPARTMENT_OTHER): Payer: Self-pay | Admitting: Family Medicine

## 2024-09-28 DIAGNOSIS — F4320 Adjustment disorder, unspecified: Secondary | ICD-10-CM | POA: Diagnosis not present

## 2024-09-29 ENCOUNTER — Encounter (HOSPITAL_BASED_OUTPATIENT_CLINIC_OR_DEPARTMENT_OTHER): Payer: Self-pay

## 2024-09-29 LAB — COLOGUARD: COLOGUARD: NEGATIVE

## 2024-09-30 ENCOUNTER — Encounter (HOSPITAL_BASED_OUTPATIENT_CLINIC_OR_DEPARTMENT_OTHER): Payer: Self-pay | Admitting: Internal Medicine

## 2024-09-30 ENCOUNTER — Other Ambulatory Visit (HOSPITAL_COMMUNITY): Payer: Self-pay

## 2024-09-30 ENCOUNTER — Telehealth: Payer: Self-pay | Admitting: Pharmacy Technician

## 2024-09-30 ENCOUNTER — Telehealth (HOSPITAL_BASED_OUTPATIENT_CLINIC_OR_DEPARTMENT_OTHER): Payer: Self-pay | Admitting: *Deleted

## 2024-09-30 ENCOUNTER — Ambulatory Visit (INDEPENDENT_AMBULATORY_CARE_PROVIDER_SITE_OTHER): Admitting: Internal Medicine

## 2024-09-30 VITALS — BP 118/82 | HR 52 | Ht 72.0 in | Wt 191.4 lb

## 2024-09-30 DIAGNOSIS — Z5181 Encounter for therapeutic drug level monitoring: Secondary | ICD-10-CM | POA: Diagnosis not present

## 2024-09-30 DIAGNOSIS — E7841 Elevated Lipoprotein(a): Secondary | ICD-10-CM | POA: Diagnosis not present

## 2024-09-30 DIAGNOSIS — Z8249 Family history of ischemic heart disease and other diseases of the circulatory system: Secondary | ICD-10-CM

## 2024-09-30 DIAGNOSIS — E785 Hyperlipidemia, unspecified: Secondary | ICD-10-CM

## 2024-09-30 MED ORDER — REPATHA SURECLICK 140 MG/ML ~~LOC~~ SOAJ: 140.0000 mg | SUBCUTANEOUS | 3 refills | Status: AC

## 2024-09-30 NOTE — Telephone Encounter (Signed)
° °  We were unable to run a test claim in wam to see if a pa was actually needed so pa was started. Per ej:[id number]:     Sent message back to staff

## 2024-09-30 NOTE — Patient Instructions (Signed)
 Medication Instructions:  WILL SEND REQUEST FOR PRIOR AUTHORIZATION FOR REPATHA , WILL LET YOU KNOW IF APPROVED   Labwork: FASTING NMR/LPa 3 MONTHS AFTER STARTING REPATHA    Testing/Procedures: NONE  Follow-Up: TO BE DETERMINED AFTER LAB WORK   Any Other Special Instructions Will Be Listed Below (If Applicable).  Subcutaneous Injection Instructions Using a Prefilled Syringe A subcutaneous injection is a shot of medicine that is given into the layer of fat and tissue between skin and muscle. The injection is given with a single-use syringe that is already filled with medicine. The syringe is called a prefilled syringe. Read the medicine guide or package insert that came with the syringe. Follow directions from the guide about how to prepare and give the injection. This is important because the directions may be different for each medicine. Use only the syringe, needle, and medicine that your health care provider prescribes. Use each prefilled syringe and needle only one time. Supplies needed: Prefilled syringe with needle. Use the needle length and size (gauge) that your provider or pharmacist gives to you. Alcohol wipes. Gauze. Bandage. A container to put used syringes. This may be a sharps container or a hard plastic container that has a secure lid, such as an empty laundry detergent bottle. How to choose a site for injection Follow instructions from your provider about where to give an injection. Do not inject in the same spot each time. There are five main areas that can be used for injecting. These areas include: Abdomen. Avoid the area that is within 2 inches (5 cm) of your navel (umbilicus). Front of thigh. Upper, outer side of thigh. Upper, outer side of arm. Upper, outer part of butt. How to give an injection using a prefilled syringe  Wash your hands with soap and water. If soap and water are not available, use hand sanitizer. Use an alcohol wipe to clean the site where you  will be injecting the needle. Let the site air-dry. Remove the plastic cover from the needle on the syringe. Do not let the needle touch anything. Hold the syringe with the needle pointing up. Check the syringe for any remaining air bubbles. If there are air bubbles, flick the syringe with your finger until the air bubbles rise to the top. Then, gently push on the plunger until you can see a drop of medicine appear at the tip of the needle. This will clear any remaining air bubbles from the syringe. Hold the syringe in your writing hand like a pencil. Use your other hand to pinch and hold about an inch (2.5 cm) of skin. Do not directly touch the cleaned part of the skin. Gently but quickly, put the needle straight into the skin. The needle should be at a 90-degree angle to the skin. The needle may need to be injected at a 45-degree angle in thin adults or children who have a small amount of body fat. Follow instructions from your provider about the right size needle and angle you should use for the injection. After the needle is completely inserted into the skin, release the skin that you are pinching. Continue to hold the syringe with your writing hand. Use your thumb or index finger of your writing hand to push the plunger all the way into the syringe to inject the medicine. Pull the needle straight out of the skin. If there is bleeding: Press and hold a piece of gauze over the injection site until bleeding stops. Do not rub the area. Cover the injection  site with a bandage, if needed. How to safely throw away the supplies If you are using a syringe that does not have a safety system for shielding the needle after injection: Do not recap the needle. Place the syringe and needle in the disposal container. If your syringe has a safety system for shielding the needle after injection: Firmly push down on the plunger after you complete the injection. The protective sleeve will automatically cover the  needle, and you will hear a click. The click means that the needle is safely covered. Follow the disposal regulations for the area where you live. Do not use any syringe or needle more than one time. Contact a health care provider if: You have trouble giving the injection. You think that the injection was not given correctly. You have trouble with any of the supplies. The medicine causes side effects. You get a rash on your skin. A get a fever. The condition that is being treated gets worse. Get help right away if: You get any of these symptoms after the injection is given: Trouble breathing. Chest pain. A rash over most or all of your body. Swelling of the lips or tongue. Trouble swallowing. These symptoms may be an emergency. Get help right away. Call 911. Do not wait to see if the symptoms will go away. Do not drive yourself to the hospital. This information is not intended to replace advice given to you by your health care provider. Make sure you discuss any questions you have with your health care provider. Document Revised: 07/18/2022 Document Reviewed: 07/18/2022 Elsevier Patient Education  2024 [employer].

## 2024-09-30 NOTE — Progress Notes (Signed)
 LIPID CLINIC CONSULT NOTE  Chief Complaint:  Reestablish lipid care  Primary Care Physician: de [place], Quintin PARAS, MD  Primary Cardiologist:  None  HPI:  Travis Hoffman is a 58 y.o. male who is being seen today for the evaluation of dyslipidemia at the request of de [place], Quintin PARAS, MD. Travis Hoffman was initially seen by myself in 2020 via audio/video conferencing. He was kindly referred by Dr. Acharya for evaluation and management of dyslipidemia.  He has a history of dyslipidemia and coronary artery calcification (CAC 225 with calcification in the right coronary and LAD, 96 percentile) and elevated LPA of 296 nmol/L in the past as well as a family history of premature coronary disease.  He reported his father had multiple vessel bypass surgery in his 74s and died of an MI at age 25.  Based on some of this testing he was identified as having dyslipidemia in his 30s and started on statin therapy.  He had previously seen Dr. Norleen Shackleton at [facility], who further recommended some changes to his lipid therapy and adding niacin.  At the time he was having side effects on Crestor  but had successfully driven his LDL down to 80.  The Crestor  was then decreased from 20 to 10 mg and ezetimibe  was added and there was discussion about adding niacin.  He also reported to Dr. Acharya that he had multiple concerns about how he could evaluate and possibly prevent some of the effects of elevated LP(a), which incidentally was measured at 296 nmol/L in February 2019.  Today we had a similar discussion about his concerns regarding elevated LP(a) and his targets of lipid therapy.  Based on his high LP(a) and strong family history of premature onset heart disease, current guidelines would suggest to target an LDL-C less than 70.  He proceeded to have a lipid particle test done by his primary care provider in October 2020.  This demonstrated an LDL-PA 1001 and 24, LDL-C of 119, HDL-C of 82 and triglycerides of 44.  Total  cholesterol was 209 and small LDL particle number was low at 200.  Overall this appears to be a better lipid profile may represent recent changes he made to his diet.  He said he tried several different diets including a vegan diet for short period of time as well as more recently a low-carb and high fat diet.  He is trying to introduce a number of antioxidants into his diet as well.  He also exercises regularly doing distance cycling as well as running.  We discussed at length about oxidized lipids and he was questioning whether we could do testing for Ox-LDL and LP-PLA2 - currently this is not standard testing outside of a research trial. Additionally, he is interested in CIMT testing - I informed him that we do not perform this, as it requires special equipment and expertise and that we prefer calcium  scoring and other risk stratifying tests.  09/30/2024  [name] is seen again today in follow-up.  Please see the excerpt from our original visit 5 years ago via telemedicine.  He has remained on ezetimibe  10 mg and rosuvastatin  10 mg which he takes every other day.  Recent lipid profile in November showed total cholesterol 201, HDL 67, triglycerides 57 and LDL 124.  As mentioned previously has a high LP(a) which was 296 nmol/L tested in outside institution.  We have previously discussed PCSK9 inhibitors as a good option since is the only medicine can  lower LP(a) however he was hesitant to pursue this.  We now have clinical trials for specific LP(a) lowering medications that he would qualify for although understands he could be randomized to placebo as well.  We had a long discussion and I answered many questions that he had accumulated regarding cardiovascular risk, mechanism of action of medications, possible side effects, impaired fasting glucose, risk of diabetes, etc.  PMHx:  Past Medical History:  Diagnosis Date   Allergy    RHINITIS   Cardiovascular disease    Dyslipidemia    GIB  (gastrointestinal bleeding)     History reviewed. No pertinent surgical history.  FAMHx:  Family History  Problem Relation Age of Onset   Arthritis Mother    Heart disease Mother        Stents   Heart disease Father 57       MI   Heart disease Paternal Grandfather     SOCHx:   reports that he has never smoked. He has never been exposed to tobacco smoke. He has never used smokeless tobacco. He reports current alcohol use of about 5.0 standard drinks of alcohol per week. He reports that he does not use drugs.  ALLERGIES:  Allergies[1]  ROS: Pertinent items noted in HPI and remainder of comprehensive ROS otherwise negative.  HOME MEDS: Medications Ordered Prior to Encounter[2]  LABS/IMAGING: No results found for this or any previous visit (from the past 48 hours). No results found.  LIPID PANEL:    Component Value Date/Time   CHOL 193 08/18/2024 0903   TRIG 57 08/18/2024 0903   HDL 61 08/18/2024 0903   CHOLHDL 3.2 08/18/2024 0903   CHOLHDL 2.9 07/28/2017 1510   VLDL 10 09/09/2016 0825   LDLCALC 121 (H) 08/18/2024 0903   LDLCALC 95 07/28/2017 1510    No results found for: LIPOA   WEIGHTS: Wt Readings from Last 3 Encounters:  09/30/24 191 lb 6.4 oz (86.8 kg)  08/18/24 183 lb (83 kg)  08/18/23 178 lb 6.4 oz (80.9 kg)    VITALS: BP 118/82   Pulse (!) 52   Ht 6' (1.829 m)   Wt 191 lb 6.4 oz (86.8 kg)   SpO2 97%   BMI 25.96 kg/m   EXAM: Deferred  EKG: Deferred  ASSESSMENT: Dyslipidemia, goal LDL less than 70 Elevated CAC score -255, 96 percentile (2019) 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   [name] has a strong family history of early onset heart disease.  He had an elevated calcium  score which was significant and age advanced in 2019 and has a high LP(a).  He is still on relatively low-dose combination therapy of ezetimibe  and rosuvastatin .  He noted that he cannot tolerate higher doses of the  statin.  He is a good candidate to add a PCSK9 inhibitor which could potentially also lower LP(a).  Will reach out for prior authorization for this.  Plan repeat lipids including NMR and LP(a) in about 3 months on therapy.  TIME SPENT WITH PATIENT: 60 minutes of direct patient care. More than 50% of that time was spent on coordination of care and counseling regarding lipid management, medications, drug mechanisms, insulin  resistance, aortic valve disease, dietary management of cholesterol and other risk reduction concerns.  Vinie KYM Maxcy, MD, [facility], FNLA, FACP  [place]  [facility]  Medical Director of the [facility]  [facility] Diplomate of the American Board of Clinical Lipidology Attending  Cardiologist  Direct Dial: [phone]  Fax: [phone]  Website:  [email or web address]  Vinie JAYSON Maxcy 09/30/2024, 12:23 PM     [1] No Known Allergies [2]  Current Outpatient Medications on File Prior to Visit  Medication Sig Dispense Refill   albuterol  (PROVENTIL  HFA;VENTOLIN  HFA) 108 (90 Base) MCG/ACT inhaler USE 2 INHALATIONS EVERY 6 HOURS AS NEEDED FOR WHEEZING 8.5 g 1   B COMPLEX-C PO Take 1 capsule by mouth.     Coenzyme Q10 (CO Q10) 100 MG CAPS Take 1 capsule by mouth.     ezetimibe  (ZETIA ) 10 MG tablet Take 1 tablet (10 mg total) by mouth daily. (Patient taking differently: Take 10 mg by mouth every other day.) 90 tablet 3   Magnesium 400 MG TABS Take 1 tablet by mouth.     rosuvastatin  (CRESTOR ) 10 MG tablet Take 1 tablet (10 mg total) by mouth daily. (Patient taking differently: Take 10 mg by mouth every other day.) 90 tablet 3   vitamin k 100 MCG tablet Take 1 tablet by mouth daily.     No current facility-administered medications on file prior to visit.

## 2024-09-30 NOTE — Telephone Encounter (Signed)
 Patient was in today seeing Dr Mona who would like to start Repatha   Will forward to PA team to initiate

## 2024-09-30 NOTE — Telephone Encounter (Signed)
 Glenice Krabbe, CPhT to Me    09/30/24 11:49 AM Hi per insurance this is covered under his plan so no prior authorization is needed. Thank you!   Rx sent to pharmacy and mychart message to patient

## 2024-10-20 ENCOUNTER — Encounter: Payer: Self-pay | Admitting: *Deleted

## 2024-10-20 DIAGNOSIS — Z006 Encounter for examination for normal comparison and control in clinical research program: Secondary | ICD-10-CM

## 2024-10-20 NOTE — Research (Signed)
 Message left for [name] about pre-event research. Encouraged him to call if he would like more information.

## 2025-08-19 ENCOUNTER — Encounter (HOSPITAL_BASED_OUTPATIENT_CLINIC_OR_DEPARTMENT_OTHER): Admitting: Family Medicine
